# Patient Record
Sex: Male | Born: 2000
Health system: Southern US, Community
[De-identification: ages and names within clinical notes are randomized; demographics above are authoritative.]

## PROBLEM LIST (undated history)

## (undated) DIAGNOSIS — R011 Cardiac murmur, unspecified: Secondary | ICD-10-CM

## (undated) DIAGNOSIS — Y249XXA Unspecified firearm discharge, undetermined intent, initial encounter: Secondary | ICD-10-CM

## (undated) DIAGNOSIS — J45909 Unspecified asthma, uncomplicated: Secondary | ICD-10-CM

## (undated) DIAGNOSIS — W3400XA Accidental discharge from unspecified firearms or gun, initial encounter: Secondary | ICD-10-CM

---
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
## 2019-04-18 DIAGNOSIS — N451 Epididymitis: Secondary | ICD-10-CM | POA: Diagnosis not present

## 2019-04-18 DIAGNOSIS — N341 Nonspecific urethritis: Secondary | ICD-10-CM | POA: Diagnosis not present

## 2019-07-20 ENCOUNTER — Other Ambulatory Visit: Payer: Self-pay

## 2019-07-20 ENCOUNTER — Encounter (HOSPITAL_COMMUNITY): Payer: Self-pay

## 2019-07-20 ENCOUNTER — Emergency Department (HOSPITAL_COMMUNITY)
Admission: EM | Admit: 2019-07-20 | Discharge: 2019-07-20 | Disposition: A | Discharge status: Home / Self Care | Payer: BC Managed Care – PPO | Attending: Emergency Medicine | Admitting: Emergency Medicine

## 2019-07-20 DIAGNOSIS — K0889 Other specified disorders of teeth and supporting structures: Secondary | ICD-10-CM | POA: Diagnosis not present

## 2019-07-20 MED ORDER — AMOXICILLIN 500 MG PO CAPS: 500.0000 mg | ORAL_CAPSULE | Freq: Three times a day (TID) | ORAL | 0 refills | Status: DC

## 2019-07-20 MED ORDER — IBUPROFEN 800 MG PO TABS: 800.0000 mg | ORAL_TABLET | Freq: Three times a day (TID) | ORAL | 0 refills | Status: DC | PRN

## 2019-07-20 NOTE — ED Provider Notes (Signed)
[place] EMERGENCY DEPARTMENT Provider Note   CSN: [id number] Arrival date & time: 07/20/19  1121     History Chief Complaint  Patient presents with  . Dental Pain    right side bottom, pain on right side of face     Noah Stewart is a 18 y.o. male with no relevant past medical history presents to the ED with a 1 week history of worsening right-sided lower tooth discomfort.  Patient reports that he has been taking ibuprofen, with some relief.  He receives regular dental care and has not had this problem before.  He denies any trauma, fevers or chills, difficulty eating or drinking, difficulty swallowing, difficulty breathing, facial swelling, headache or dizziness, or other symptoms.  His mother thought that he may be developing an abscess which is what prompted him to come to the ED for evaluation.  HPI     Past Medical History:  Diagnosis Date  . Murmur     There are no problems to display for this patient.   History reviewed. No pertinent surgical history.     Family History  Problem Relation Age of Onset  . Hypertension Mother   . Heart disease Maternal Grandmother   . Sudden death Maternal Grandmother     Social History   Tobacco Use  . Smoking status: Never Smoker  . Smokeless tobacco: Never Used  Substance Use Topics  . Alcohol use: No  . Drug use: Not Currently    Home Medications Prior to Admission medications   Medication Sig Start Date End Date Taking? Authorizing Provider  albuterol (PROVENTIL HFA;VENTOLIN HFA) 108 (90 BASE) MCG/ACT inhaler Inhale 1-2 puffs into the lungs every 6 (six) hours as needed for wheezing or shortness of breath.    [provider]  amoxicillin (AMOXIL) 500 MG capsule Take 1 capsule (500 mg total) by mouth 3 (three) times daily. 07/20/19   Corena Herter, PA-C  ibuprofen (ADVIL) 800 MG tablet Take 1 tablet (800 mg total) by mouth 3 (three) times daily as needed for moderate pain. 07/20/19    Corena Herter, PA-C  montelukast (SINGULAIR) 4 MG chewable tablet Chew 4 mg by mouth at bedtime.    [provider]  Phenylephrine-DM-GG-APAP [facility] CHILD MULTI-SYMPTOM) 5-10-200-325 MG/10ML LIQD Take 30 mLs by mouth daily as needed (for congestion).    [provider]    Allergies    Patient has no known allergies.  Review of Systems   Review of Systems  Constitutional: Negative for chills and fever.  HENT: Negative for drooling, facial swelling, sore throat, trouble swallowing and voice change.   Neurological: Negative for headaches.    Physical Exam Updated Vital Signs BP 119/72 (BP Location: Right Arm)   Pulse 66   Temp 98.3 F (36.8 C) (Oral)   Resp 16   Ht 6' (1.829 m)   Wt 77.6 kg   SpO2 99%   BMI 23.19 kg/m   Physical Exam Vitals and nursing note reviewed. Exam conducted with a chaperone present.  Constitutional:      Appearance: Normal appearance.  HENT:     Head: Normocephalic and atraumatic.     Mouth/Throat:     Comments: Very mild surrounding erythema at #31.  No significant decay or caries noted.  No soft palate induration or swelling.  No uvular deviation.  Oropharynx is patent.  No masses appreciated.  No evidence of fluctuance concerning for abscess.  No significant tenderness on physical exam.  No  trismus.  Tolerating secretions well.  No voice changes. Eyes:     General: No scleral icterus.    Conjunctiva/sclera: Conjunctivae normal.  Neck:     Comments: Mild right-sided lymphadenitis.  No significant swelling or masses appreciated. Cardiovascular:     Rate and Rhythm: Normal rate and regular rhythm.     Pulses: Normal pulses.     Heart sounds: Normal heart sounds.  Pulmonary:     Effort: Pulmonary effort is normal. No respiratory distress.     Breath sounds: Normal breath sounds.  Musculoskeletal:     Cervical back: Normal range of motion and neck supple.  Skin:    General: Skin is dry.  Neurological:     Mental Status:  He is alert.     GCS: GCS eye subscore is 4. GCS verbal subscore is 5. GCS motor subscore is 6.  Psychiatric:        Mood and Affect: Mood normal.        Behavior: Behavior normal.        Thought Content: Thought content normal.       ED Results / Procedures / Treatments   Labs (all labs ordered are listed, but only abnormal results are displayed) Labs Reviewed - No data to display  EKG None  Radiology No results found.  Procedures Procedures (including critical care time)  Medications Ordered in ED Medications - No data to display  ED Course  I have reviewed the triage vital signs and the nursing notes.  Pertinent labs & imaging results that were available during my care of the patient were reviewed by me and considered in my medical decision making (see chart for details).    MDM Rules/Calculators/A&P                      Patient presents to the ED with a 1 week history of worsening right-sided lower dental discomfort.  On physical exam, mild erythema surrounding #31 tooth concerning for possible infection given his reported worsening pain and discomfort.  Will treat with amoxicillin.  Patient has been taking ibuprofen, with some relief.  Will also prescribe 100 mg ibuprofen 3 times daily as needed for his pain discomfort.    No fluctuance or masses on physical exam suspicious for dental abscess that would require incision and drainage.  No trismus, soft palate induration or swelling, inability to tolerate secretions, inability eat or drink, facial swelling, fevers or chills, uvular deviation, voice changes, or other concerning history or physical exam findings.  Low suspicion for PTA, retropharyngeal abscess, parotitis, or other more emergent conditions.  Patient is in no acute distress and vital signs are within normal limits.  Instructed him to follow-up with his dentist or PCP regarding today's encounter.  Return to the ED or seek medical attention immediately for any new  or worsening symptoms.  Patient voiced understanding and is agreeable to plan.   Final Clinical Impression(s) / ED Diagnoses Final diagnoses:  Pain, dental    Rx / DC Orders ED Discharge Orders         Ordered    amoxicillin (AMOXIL) 500 MG capsule  3 times daily     07/20/19 1239    ibuprofen (ADVIL) 800 MG tablet  3 times daily PRN     07/20/19 1239           Lorelee New, PA-C 07/20/19 1240    Loren Racer, MD 07/20/19 1357

## 2019-07-20 NOTE — ED Triage Notes (Signed)
Pt had pain for about a week, but it has gotten worse. Pain on right side bottom tooth.

## 2019-07-20 NOTE — Discharge Instructions (Signed)
Please follow-up with your PCP and/or dentist regarding today's encounter.  Please take your medications, as prescribed.  Return to the ED or seek medical attention for any new or worsening symptoms including but not limited to fevers or chills, inability to open the mouth, soft palate swelling, tongue swelling, drooling, or inability to eat or drink.

## 2019-11-08 ENCOUNTER — Encounter (HOSPITAL_COMMUNITY): Payer: Self-pay

## 2019-11-08 ENCOUNTER — Other Ambulatory Visit: Payer: Self-pay

## 2019-11-08 ENCOUNTER — Emergency Department (HOSPITAL_COMMUNITY): Payer: BC Managed Care – PPO

## 2019-11-08 ENCOUNTER — Emergency Department (HOSPITAL_COMMUNITY)
Admission: EM | Admit: 2019-11-08 | Discharge: 2019-11-08 | Disposition: A | Discharge status: Home / Self Care | Payer: BC Managed Care – PPO | Attending: Emergency Medicine | Admitting: Emergency Medicine

## 2019-11-08 DIAGNOSIS — Z79899 Other long term (current) drug therapy: Secondary | ICD-10-CM | POA: Insufficient documentation

## 2019-11-08 DIAGNOSIS — R0789 Other chest pain: Secondary | ICD-10-CM | POA: Insufficient documentation

## 2019-11-08 DIAGNOSIS — R079 Chest pain, unspecified: Secondary | ICD-10-CM

## 2019-11-08 LAB — CBC
HCT: 45.3 % (ref 39.0–52.0)
Hemoglobin: 14.8 g/dL (ref 13.0–17.0)
MCH: 28.7 pg (ref 26.0–34.0)
MCHC: 32.7 g/dL (ref 30.0–36.0)
MCV: 88 fL (ref 80.0–100.0)
Platelets: 295 10*3/uL (ref 150–400)
RBC: 5.15 MIL/uL (ref 4.22–5.81)
RDW: 14 % (ref 11.5–15.5)
WBC: 6.9 10*3/uL (ref 4.0–10.5)
nRBC: 0 % (ref 0.0–0.2)

## 2019-11-08 LAB — BASIC METABOLIC PANEL
Anion gap: 12 (ref 5–15)
BUN: 13 mg/dL (ref 6–20)
CO2: 23 mmol/L (ref 22–32)
Calcium: 8.8 mg/dL — ABNORMAL LOW (ref 8.9–10.3)
Chloride: 103 mmol/L (ref 98–111)
Creatinine, Ser: 0.96 mg/dL (ref 0.61–1.24)
GFR calc Af Amer: >60 mL/min (ref >60)
GFR calc non Af Amer: >60 mL/min (ref >60)
Glucose, Bld: 101 mg/dL — ABNORMAL HIGH (ref 70–99)
Potassium: 3.7 mmol/L (ref 3.5–5.1)
Sodium: 138 mmol/L (ref 135–145)

## 2019-11-08 LAB — TROPONIN I (HIGH SENSITIVITY)
Troponin I (High Sensitivity): <2 ng/L (ref <18)
Troponin I (High Sensitivity): <2 ng/L (ref <18)

## 2019-11-08 MED ORDER — SODIUM CHLORIDE 0.9% FLUSH: 3.0000 mL | Freq: Once | INTRAVENOUS | Status: DC

## 2019-11-08 MED ORDER — ACETAMINOPHEN 325 MG PO TABS
650.0000 mg | ORAL_TABLET | Freq: Once | ORAL | Status: AC
  Administered 2019-11-08: 09:00:00 650 mg via ORAL
  Filled 2019-11-08: qty 2

## 2019-11-08 NOTE — ED Triage Notes (Signed)
Pt reports that he began to have L sided CP that started an hour ago with SOB.

## 2019-11-08 NOTE — ED Provider Notes (Signed)
[facility] DEPARTMENT Provider Note   CSN: [id number] Arrival date & time: 11/08/19  0304     History Chief Complaint  Patient presents with  . Chest Pain    Vernon MUMIN DENOMME is a 19 y.o. male.  HPI 19 year old male no significant past medical history presents today complaining of left-sided chest pain. He describes left-sided pressure burning in his chest that began around 2 AM. He states there is some pain when he moves and with deep inspiration. He denies dyspnea. He has not had fever, chills, cough, leg swelling, history of DVT, PE, sickle cell anemia, or family history of sudden early cardiac death. He denies any similar symptoms in the past. The pain is moderate. He feels that the mask in the ED makes this somewhat worse.    Past Medical History:  Diagnosis Date  . Murmur     There are no problems to display for this patient.   History reviewed. No pertinent surgical history.     Family History  Problem Relation Age of Onset  . Hypertension Mother   . Heart disease Maternal Grandmother   . Sudden death Maternal Grandmother     Social History   Tobacco Use  . Smoking status: Never Smoker  . Smokeless tobacco: Never Used  Substance Use Topics  . Alcohol use: No  . Drug use: Not Currently    Home Medications Prior to Admission medications   Medication Sig Start Date End Date Taking? Authorizing Provider  albuterol (PROVENTIL HFA;VENTOLIN HFA) 108 (90 BASE) MCG/ACT inhaler Inhale 1-2 puffs into the lungs every 6 (six) hours as needed for wheezing or shortness of breath.    [provider]  amoxicillin (AMOXIL) 500 MG capsule Take 1 capsule (500 mg total) by mouth 3 (three) times daily. 07/20/19   Corena Herter, PA-C  ibuprofen (ADVIL) 800 MG tablet Take 1 tablet (800 mg total) by mouth 3 (three) times daily as needed for moderate pain. 07/20/19   Corena Herter, PA-C  montelukast (SINGULAIR) 4 MG chewable tablet Chew  4 mg by mouth at bedtime.    [provider]  Phenylephrine-DM-GG-APAP [facility] CHILD MULTI-SYMPTOM) 5-10-200-325 MG/10ML LIQD Take 30 mLs by mouth daily as needed (for congestion).    [provider]    Allergies    Patient has no known allergies.  Review of Systems   Review of Systems  All other systems reviewed and are negative.   Physical Exam Updated Vital Signs BP 110/72   Pulse (!) 52   Temp 98.5 F (36.9 C) (Oral)   Resp 17   Ht 1.829 m (6')   Wt 77.1 kg   SpO2 93%   BMI 23.06 kg/m   Physical Exam Vitals and nursing note reviewed.  Constitutional:      Appearance: He is well-developed.  HENT:     Head: Normocephalic and atraumatic.     Right Ear: External ear normal.     Left Ear: External ear normal.     Nose: Nose normal.  Eyes:     Conjunctiva/sclera: Conjunctivae normal.     Pupils: Pupils are equal, round, and reactive to light.  Cardiovascular:     Rate and Rhythm: Normal rate and regular rhythm.     Heart sounds: Normal heart sounds.  Pulmonary:     Effort: Pulmonary effort is normal.     Breath sounds: Normal breath sounds.  Abdominal:     General: Bowel sounds are normal.  Palpations: Abdomen is soft.  Musculoskeletal:        General: Normal range of motion.     Cervical back: Normal range of motion and neck supple.  Skin:    General: Skin is warm and dry.  Neurological:     Mental Status: He is alert and oriented to person, place, and time.     Deep Tendon Reflexes: Reflexes are normal and symmetric.  Psychiatric:        Behavior: Behavior normal.        Thought Content: Thought content normal.        Judgment: Judgment normal.     ED Results / Procedures / Treatments   Labs (all labs ordered are listed, but only abnormal results are displayed) Labs Reviewed  BASIC METABOLIC PANEL - Abnormal; Notable for the following components:      Result Value   Glucose, Bld 101 (*)    Calcium 8.8 (*)    All other  components within normal limits  CBC  TROPONIN I (HIGH SENSITIVITY)  TROPONIN I (HIGH SENSITIVITY)    EKG EKG Interpretation  Date/Time:  Thursday November 08 2019 03:10:38 EDT Ventricular Rate:  82 PR Interval:  154 QRS Duration: 88 QT Interval:  348 QTC Calculation: 406 R Axis:   79 Text Interpretation: Normal sinus rhythm with sinus arrhythmia Normal ECG When compared with ECG of 01/27/2017, No significant change was found Confirmed by Dione Booze ([id number]) on 11/08/2019 3:18:26 AM  EKG personally reviewed and agree with reading Radiology DG Chest 2 View  Result Date: 11/08/2019 CLINICAL DATA:  Chest pain EXAM: CHEST - 2 VIEW COMPARISON:  None. FINDINGS: The heart size and mediastinal contours are within normal limits. Both lungs are clear. The visualized skeletal structures are unremarkable. IMPRESSION: No active cardiopulmonary disease. Electronically Signed   By: Sharlet Salina M.D.   On: 11/08/2019 03:29   Chest x-Lenford Beddow reviewed and agree with read Procedures Procedures (including critical care time)  Medications Ordered in ED Medications  sodium chloride flush (NS) 0.9 % injection 3 mL (0 mLs Intravenous Hold 11/08/19 0801)    ED Course  I have reviewed the triage vital signs and the nursing notes.  Pertinent labs & imaging results that were available during my care of the patient were reviewed by me and considered in my medical decision making (see chart for details).  Clinical Course as of Nov 07 904  Thu Nov 08, 2019  0905 CBC, bmet and troponins reviewed   [DR]    Clinical Course User Index [DR] Margarita Grizzle, MD   MDM Rules/Calculators/A&P                     Well-developed well-nourished 19 year old male no sniffing a past medical history with nonreproducible chest pain. EKG here normal and troponin normal x2. Very low index of suspicion for acute coronary syndrome/MI. Patient's lungs are clear and chest x-Jovanny Stephanie reveals no evidence of pneumothorax, infiltrate,  widened mediastinum, chest wall injury. Physical exam reveals normal abdomen and have low index of suspicion for upper abdominal process space specifically GI origin such as biliary colic or gastritis. However reflux could be considered as source. Also doubt embolic disease as patient has normal oxygen saturations and normal heart rate. Discussed above with patient. We have reviewed return precautions and need for follow-up and he voices understanding. Final Clinical Impression(s) / ED Diagnoses Final diagnoses:  Nonspecific chest pain    Rx / DC Orders ED Discharge Orders  None       Margarita Grizzle, MD 11/08/19 [phone]

## 2019-11-08 NOTE — ED Notes (Signed)
De Burrs would like an update [phone]

## 2020-02-24 ENCOUNTER — Ambulatory Visit (HOSPITAL_COMMUNITY)
Admission: EM | Admit: 2020-02-24 | Discharge: 2020-02-24 | Disposition: A | Discharge status: Home / Self Care | Payer: BC Managed Care – PPO

## 2020-02-24 ENCOUNTER — Other Ambulatory Visit: Payer: Self-pay

## 2020-02-24 NOTE — ED Notes (Signed)
Went to call patient for rooming and triage. Patient not in lobby. No patients in lobby at this time.

## 2020-02-25 ENCOUNTER — Other Ambulatory Visit: Payer: Self-pay

## 2020-02-25 ENCOUNTER — Emergency Department (HOSPITAL_COMMUNITY)
Admission: EM | Admit: 2020-02-25 | Discharge: 2020-02-25 | Disposition: A | Discharge status: Home / Self Care | Payer: BC Managed Care – PPO | Attending: Emergency Medicine | Admitting: Emergency Medicine

## 2020-02-25 ENCOUNTER — Encounter (HOSPITAL_COMMUNITY): Payer: Self-pay | Admitting: Emergency Medicine

## 2020-02-25 DIAGNOSIS — J029 Acute pharyngitis, unspecified: Secondary | ICD-10-CM | POA: Diagnosis present

## 2020-02-25 DIAGNOSIS — J351 Hypertrophy of tonsils: Secondary | ICD-10-CM | POA: Insufficient documentation

## 2020-02-25 LAB — GROUP A STREP BY PCR: Group A Strep by PCR: NOT DETECTED

## 2020-02-25 MED ORDER — ACETAMINOPHEN 325 MG PO TABS
650.0000 mg | ORAL_TABLET | Freq: Once | ORAL | Status: DC | PRN
  Filled 2020-02-25: qty 2

## 2020-02-25 NOTE — Discharge Instructions (Signed)
You have enlarged tonsils.  Continue Motrin for pain.  See ENT doctor in the month if you have persistent swelling of your tonsils.  Return to ER if you have trouble swallowing, trouble breathing, fever.

## 2020-02-25 NOTE — ED Triage Notes (Signed)
Patient is complaining of sore throat and on the right side in the back his mouth has a hole that patient is concerned about.

## 2020-02-25 NOTE — ED Provider Notes (Addendum)
[facility]-EMERGENCY DEPT Provider Note   CSN: [id number] Arrival date & time: 02/25/20  0157     History Chief Complaint  Patient presents with  . Sore Throat    Noah Stewart is a 19 y.o. male also healthy here presenting with sore throat.  Patient has been having sore throat for the last 2 weeks.  Patient states that he thought there may be a hole behind his right tonsils about 2 weeks ago.  Patient has been taking ibuprofen with minimal relief.  Patient has a nonproductive cough but denies any fevers or chills or shortness of breath.  Patient did not receive the Covid vaccine.  Denies any sick contacts.  Denies any recurrent throat infections.  The history is provided by the patient.       Past Medical History:  Diagnosis Date  . Murmur     There are no problems to display for this patient.   History reviewed. No pertinent surgical history.     Family History  Problem Relation Age of Onset  . Hypertension Mother   . Heart disease Maternal Grandmother   . Sudden death Maternal Grandmother     Social History   Tobacco Use  . Smoking status: Never Smoker  . Smokeless tobacco: Never Used  Vaping Use  . Vaping Use: Never used  Substance Use Topics  . Alcohol use: No  . Drug use: Not Currently    Home Medications Prior to Admission medications   Medication Sig Start Date End Date Taking? Authorizing Provider  albuterol (PROVENTIL HFA;VENTOLIN HFA) 108 (90 BASE) MCG/ACT inhaler Inhale 1-2 puffs into the lungs every 6 (six) hours as needed for wheezing or shortness of breath.    [provider]  amoxicillin (AMOXIL) 500 MG capsule Take 1 capsule (500 mg total) by mouth 3 (three) times daily. 07/20/19   Lorelee New, PA-C  ibuprofen (ADVIL) 800 MG tablet Take 1 tablet (800 mg total) by mouth 3 (three) times daily as needed for moderate pain. 07/20/19   Lorelee New, PA-C  montelukast (SINGULAIR) 4 MG chewable tablet Chew 4  mg by mouth at bedtime.    [provider]  Phenylephrine-DM-GG-APAP [facility] CHILD MULTI-SYMPTOM) 5-10-200-325 MG/10ML LIQD Take 30 mLs by mouth daily as needed (for congestion).    [provider]    Allergies    Patient has no known allergies.  Review of Systems   Review of Systems  HENT: Positive for sore throat.   All other systems reviewed and are negative.   Physical Exam Updated Vital Signs BP (!) 127/89 (BP Location: Left Arm)   Pulse 87   Temp 98.9 F (37.2 C) (Oral)   Resp 14   Ht 6\' 1"  (1.854 m)   Wt 72.6 kg   SpO2 98%   BMI 21.11 kg/m   Physical Exam Vitals and nursing note reviewed.  Constitutional:      Appearance: He is well-developed.  HENT:     Head: Normocephalic.     Mouth/Throat:     Tonsils: Tonsillar abscess present. No tonsillar exudate.     Comments: Bilateral tonsils are enlarged but not red.  Uvula is midline.  There is no tonsillar exudates.  There is no obvious hole behind the tonsils. Eyes:     Conjunctiva/sclera: Conjunctivae normal.  Neck:     Comments: No cervical lymphadenopathy. Cardiovascular:     Rate and Rhythm: Normal rate and regular rhythm.  Pulmonary:     Effort:  Pulmonary effort is normal.     Breath sounds: Normal breath sounds.  Abdominal:     Palpations: Abdomen is soft.  Musculoskeletal:     Cervical back: Normal range of motion.  Skin:    General: Skin is warm.     Capillary Refill: Capillary refill takes less than 2 seconds.  Neurological:     General: No focal deficit present.     Mental Status: He is alert and oriented to person, place, and time.  Psychiatric:        Mood and Affect: Mood normal.        Behavior: Behavior normal.     ED Results / Procedures / Treatments   Labs (all labs ordered are listed, but only abnormal results are displayed) Labs Reviewed  GROUP A STREP BY PCR    EKG None  Radiology No results found.  Procedures Procedures (including critical care  time)  Medications Ordered in ED Medications  acetaminophen (TYLENOL) tablet 650 mg (650 mg Oral Refused 02/25/20 0417)    ED Course  I have reviewed the triage vital signs and the nursing notes.  Pertinent labs & imaging results that were available during my care of the patient were reviewed by me and considered in my medical decision making (see chart for details).    MDM Rules/Calculators/A&P                         Noah Stewart is a 19 y.o. male here presenting with enlarged tonsils.  The tonsils are enlarged but does not appear infected.  Uvula is midline is no signs of peritonsillar abscess.  Patient's strep test is negative.  At this point, will hold off antibiotics.  Will refer to ENT recommend continue Motrin. Considered COVID infection and offered COVID testing but patient refused.    Final Clinical Impression(s) / ED Diagnoses Final diagnoses:  Enlarged tonsils    Rx / DC Orders ED Discharge Orders    None       Charlynne Pander, MD 02/25/20 1224    Charlynne Pander, MD 02/25/20 0430

## 2020-10-06 ENCOUNTER — Other Ambulatory Visit: Payer: Self-pay

## 2020-10-06 ENCOUNTER — Emergency Department (HOSPITAL_COMMUNITY)
Admission: EM | Admit: 2020-10-06 | Discharge: 2020-10-07 | Disposition: A | Discharge status: Home / Self Care | Payer: BC Managed Care – PPO | Source: Home / Self Care | Attending: Emergency Medicine | Admitting: Emergency Medicine

## 2020-10-06 ENCOUNTER — Encounter (HOSPITAL_COMMUNITY): Payer: Self-pay | Admitting: Emergency Medicine

## 2020-10-06 DIAGNOSIS — Z20822 Contact with and (suspected) exposure to covid-19: Secondary | ICD-10-CM | POA: Insufficient documentation

## 2020-10-06 DIAGNOSIS — F333 Major depressive disorder, recurrent, severe with psychotic symptoms: Secondary | ICD-10-CM | POA: Insufficient documentation

## 2020-10-06 DIAGNOSIS — F332 Major depressive disorder, recurrent severe without psychotic features: Secondary | ICD-10-CM | POA: Diagnosis not present

## 2020-10-06 DIAGNOSIS — R45850 Homicidal ideations: Secondary | ICD-10-CM

## 2020-10-06 DIAGNOSIS — F1210 Cannabis abuse, uncomplicated: Secondary | ICD-10-CM | POA: Diagnosis not present

## 2020-10-06 DIAGNOSIS — Z560 Unemployment, unspecified: Secondary | ICD-10-CM | POA: Diagnosis not present

## 2020-10-06 DIAGNOSIS — Z79899 Other long term (current) drug therapy: Secondary | ICD-10-CM | POA: Diagnosis not present

## 2020-10-06 DIAGNOSIS — R45851 Suicidal ideations: Secondary | ICD-10-CM

## 2020-10-06 LAB — COMPREHENSIVE METABOLIC PANEL
ALT: 12 U/L (ref 0–44)
AST: 16 U/L (ref 15–41)
Albumin: 4.2 g/dL (ref 3.5–5.0)
Alkaline Phosphatase: 40 U/L (ref 38–126)
Anion gap: 7 (ref 5–15)
BUN: 8 mg/dL (ref 6–20)
CO2: 25 mmol/L (ref 22–32)
Calcium: 9.4 mg/dL (ref 8.9–10.3)
Chloride: 105 mmol/L (ref 98–111)
Creatinine, Ser: 0.91 mg/dL (ref 0.61–1.24)
GFR, Estimated: >60 mL/min (ref >60)
Glucose, Bld: 80 mg/dL (ref 70–99)
Potassium: 4.8 mmol/L (ref 3.5–5.1)
Sodium: 137 mmol/L (ref 135–145)
Total Bilirubin: 1.1 mg/dL (ref 0.3–1.2)
Total Protein: 7.9 g/dL (ref 6.5–8.1)

## 2020-10-06 LAB — CBC
HCT: 49.6 % (ref 39.0–52.0)
Hemoglobin: 16 g/dL (ref 13.0–17.0)
MCH: 28.9 pg (ref 26.0–34.0)
MCHC: 32.3 g/dL (ref 30.0–36.0)
MCV: 89.5 fL (ref 80.0–100.0)
Platelets: 275 10*3/uL (ref 150–400)
RBC: 5.54 MIL/uL (ref 4.22–5.81)
RDW: 14.1 % (ref 11.5–15.5)
WBC: 5.1 10*3/uL (ref 4.0–10.5)
nRBC: 0 % (ref 0.0–0.2)

## 2020-10-06 LAB — RAPID URINE DRUG SCREEN, HOSP PERFORMED
Amphetamines: NOT DETECTED
Barbiturates: NOT DETECTED
Benzodiazepines: NOT DETECTED
Cocaine: NOT DETECTED
Opiates: NOT DETECTED
Tetrahydrocannabinol: POSITIVE — AB

## 2020-10-06 LAB — SALICYLATE LEVEL: Salicylate Lvl: <7 mg/dL — ABNORMAL LOW (ref 7.0–30.0)

## 2020-10-06 LAB — ETHANOL: Alcohol, Ethyl (B): <10 mg/dL (ref <10)

## 2020-10-06 LAB — RESP PANEL BY RT-PCR (FLU A&B, COVID) ARPGX2
Influenza A by PCR: NEGATIVE
Influenza B by PCR: NEGATIVE
SARS Coronavirus 2 by RT PCR: NEGATIVE

## 2020-10-06 LAB — ACETAMINOPHEN LEVEL: Acetaminophen (Tylenol), Serum: <10 ug/mL — ABNORMAL LOW (ref 10–30)

## 2020-10-06 MED ORDER — LORAZEPAM 1 MG PO TABS
1.0000 mg | ORAL_TABLET | Freq: Once | ORAL | Status: AC
  Administered 2020-10-06: 1 mg via ORAL
  Filled 2020-10-06: qty 1

## 2020-10-06 NOTE — ED Triage Notes (Signed)
Patient coming from home. Complaint of SI/HI today. Per parent, this has been on ongoing issue, today however was the first time patient verbalized a plan. Patient closed-off, unwilling to speak in triage.

## 2020-10-06 NOTE — ED Notes (Signed)
Dinner tray ordered.

## 2020-10-06 NOTE — BH Assessment (Signed)
Clinician spoke to [place], [place], pt to be placed in a private room for assessment. Clinician to call the TTS cart in 15 minutes.     Redmond Pulling, MS, [facility], [facility] Triage Specialist [phone]

## 2020-10-06 NOTE — ED Notes (Signed)
ED Provider at bedside. 

## 2020-10-06 NOTE — BH Assessment (Signed)
Comprehensive Clinical Assessment (CCA) Note  10/06/2020 Noah Stewart [id number]   Disposition: Noah BackEddie Nwoko, PA-C recommends inpatient treatment. [facility]. Disposition discussed with Noah BareLogan, PA-C and Noah RungMonique, RN. Disposition CSW to seek placement.   [facility]  C-SSRS RISK CATEGORY High Risk     Per Noah RungMonique, RN pt has a Comptrollersitter.   The patient demonstrates the following risk factors for suicide: Chronic risk factors for suicide include: previous self-harm pt reports, punching walls, scratching himself. Acute risk factors for suicide include: family or marital conflict, unemployment and pt's grandmother passessed away but kicked out of his mothers' home, SI with a plan, HI. Protective factors for this patient include: positive social support. Considering these factors, the overall suicide risk at this point appears to be high. Patient is appropriate for outpatient follow up.  Noah Stewart is a 20 year old male who [facility] Clinician asked the pt, "what brought you to the hospital?" Pt reported, his father felt like he needed help because their conversations escalate quickly. Pt reported, he's been flipping out (verbally, breaking is phone, punching walls.) Pt reported, about a month ago, his maternal grandmother passed away and shortly after he got into an argument with his mother and was kicked out of her home. Pt reported, he was told he punched walls but doesn't remember doing it. Pt reported, he blacks out at times. Pt reported, when his mother kicked him out he grew a big hatred for her. Pt reported, "I'm going to hurt myself or others." Pt reported, he's suicidal, he has a lot of different thoughts, plans of strangulation, shooting himself. Pt reported, when he's suicidal he's homicidal. Pt reported, he rather kill his stepfather so his mother can feel how he feels.  Pt reported, he feel his depression has worsened. Pt reported, "I don't want to be here no more." Pt reported, feeling someone is out to get him. Pt denies, access to weapons.   Pt reports, marijuana use two weeks ago but doesn't use often. Pt's UDS is positive for marijuana. Pt denies, being linked to OPT resources (medication management and/or counseling.) Pt denies, previous inpatient admissions.   Pt presents quiet, awake in scrubs with normal speech. Pt's eye contact was poor pt looked down mostly during the assessment. Pt's mood was depressed. Pt's affect was depressed, flat. Pt's thought process was appropriate to mood and circumstances. [facility], [facility] can [facility] for [facility].   *Pt consented for clinician to contact his father Noah Stewart(Noah Stewart, [phone]) to obtain additional information. Per father, the pt has been having a rough times over the last few weeks. Per father, the pt loss his maternal great-grandmother, he was kicked out of his mothers' house in the middle of the night, breakup with girlfriend. Pt's father reported, the pt can be verbally and physically aggressive he has not been physically aggressive towards him. Per father, the pt was going to leave him a letter that is when he discussed getting help with the pt. Pt's father reported, it worries him if the pt were to be discharged after the pt gets the help he needs he will have the pt move Stewart in with him.*   Diagnosis: Major Depressive Disorder, recurrent, severe with psychotic features.   Chief Complaint:  Chief Complaint  Patient presents with  . Suicidal  . Homicidal   Visit Diagnosis:  CCA Screening, Triage and Referral (STR)  Patient Reported Information How did you hear about [place]? No data recorded Referral name: No data recorded Referral phone number: No data recorded  Whom do you see for routine medical problems? No data recorded Practice/Facility Name: No data  recorded Practice/Facility Phone Number: No data recorded Name of Contact: No data recorded Contact Number: No data recorded Contact Fax Number: No data recorded Prescriber Name: No data recorded Prescriber Address (if known): No data recorded  What Is the Reason for Your Visit/Call Today? No data recorded How Long Has This Been Causing You Problems? No data recorded What Do You Feel Would Help You the Most Today? No data recorded  Have You Recently Been in Any Inpatient Treatment (Hospital/Detox/Crisis Center/28-Day Program)? No data recorded Name/Location of Program/Hospital:No data recorded How Long Were You There? No data recorded When Were You Discharged? No data recorded  Have You Ever Received Services From [facility] Before? No data recorded Who Do You See at [facility]? No data recorded  Have You Recently Had Any Thoughts About Hurting Yourself? No data recorded Are You Planning to Commit Suicide/Harm Yourself At This time? No data recorded  Have you Recently Had Thoughts About Hurting Someone Noah Stewart? No data recorded Explanation: No data recorded  Have You Used Any Alcohol or Drugs in the Past 24 Hours? No data recorded How Long Ago Did You Use Drugs or Alcohol? No data recorded What Did You Use and How Much? No data recorded  Do You Currently Have a Therapist/Psychiatrist? No data recorded Name of Therapist/Psychiatrist: No data recorded  Have You Been Recently Discharged From Any Office Practice or Programs? No data recorded Explanation of Discharge From Practice/Program: No data recorded    CCA Screening Triage Referral Assessment Type of Contact: No data recorded Is this Initial or Reassessment? No data recorded Date Telepsych consult ordered in [facility]:  No data recorded Time Telepsych consult ordered in [facility]:  No data recorded  Patient Reported Information Reviewed? No data recorded Patient Left Without Being Seen? No data recorded Reason for Not Completing  Assessment: No data recorded  Collateral Involvement: No data recorded  Does Patient Have a Court Appointed Legal Guardian? No data recorded Name and Contact of Legal Guardian: No data recorded If Minor and Not Living with Parent(s), Who has Custody? No data recorded Is CPS involved or ever been involved? No data recorded Is APS involved or ever been involved? No data recorded  Patient Determined To Be At Risk for Harm To Self or Others Based on Review of Patient Reported Information or Presenting Complaint? No data recorded Method: No data recorded Availability of Means: No data recorded Intent: No data recorded Notification Required: No data recorded Additional Information for Danger to Others Potential: No data recorded Additional Comments for Danger to Others Potential: No data recorded Are There Guns or Other Weapons in Your Home? No data recorded Types of Guns/Weapons: No data recorded Are These Weapons Safely Secured?                            No data recorded Who Could Verify You Are Able To Have These Secured: No data recorded Do You Have any Outstanding Charges, Pending Court Dates, Parole/Probation? No data recorded Contacted To Inform of Risk of Harm To Self or Others: No data recorded  Location of Assessment: No data recorded  Does Patient Present under Involuntary Commitment? No data recorded IVC  Papers Initial File Date: No data recorded  [place] of Residence: No data recorded  Patient Currently Receiving the Following Services: No data recorded  Determination of Need: No data recorded  Options For Referral: No data recorded    CCA Biopsychosocial Intake/Chief Complaint:  Per EDP/PA note: "Patient is a 20 year old male with no pertinent medical history who presents the emergency department due to SI/HI.  He was brought into the emergency department today by his father. Patient states that he has been depressed for a long time. He was close with his grandmother  who passed away about 3 to 4 weeks ago. He feels as if his symptoms have been worsening since then. He states "I feel like I am alone".  He also notes "I do not feel like there is anyone there for me". He notes having thoughts of hurting himself. No specific plan just stating that "I could do anything". Also reports HI. States that he does not want to hurt anyone specific but just "wants other people to feel his pain" before hurting himself.  Patient has no somatic complaints at this time. States he smokes marijuana intermittently but denies any alcohol use. Denies any other drug use."  Current Symptoms/Problems: SI/HI, paranoia, depression/anixety symptoms.   Patient Reported Schizophrenia/Schizoaffective Diagnosis in Past: No data recorded  Strengths: Not assessed.  Preferences: Not assessed.  Abilities: Not assessed.   Type of Services Patient Feels are Needed: Pt reported, if discharged [facility] he can contract for safety. Pt's fathe repored, it worries him if the pt is discharged based on his SI/HI.   Initial Clinical Notes/Concerns: No data recorded  Mental Health Symptoms Depression:  Change in energy/activity; Sleep (too much or little); Tearfulness; Difficulty Concentrating; Worthlessness; Weight gain/loss; Hopelessness; Increase/decrease in appetite; Fatigue (Isolation, guilt.)   Duration of Depressive symptoms: Greater than two weeks   Mania:  No data recorded  Anxiety:   Worrying; Restlessness; Irritability; Fatigue   Psychosis:  -- (Paranoia.)   Duration of Psychotic symptoms: No data recorded  Trauma:  No data recorded  Obsessions:  None   Compulsions:  None   Inattention:  Forgetful; Loses things; Disorganized   Hyperactivity/Impulsivity:  Blurts out answers   Oppositional/Defiant Behaviors:  Argumentative; Easily annoyed   Emotional Irregularity:  Intense/inappropriate anger; Chronic feelings of emptiness; Recurrent suicidal behaviors/gestures/threats    Other Mood/Personality Symptoms:  No data recorded   Mental Status Exam Appearance and self-care  Stature:  Average   Weight:  Average weight   Clothing:  -- (Pt in scrubs.)   Grooming:  No data recorded  Cosmetic use:  Excessive   Posture/gait:  No data recorded  Motor activity:  Not Remarkable   Sensorium  Attention:  Normal   Concentration:  Normal   Orientation:  X5   Recall/memory:  Normal   Affect and Mood  Affect:  Depressed; Flat   Mood:  Depressed   Relating  Eye contact:  -- (Poor, pt had his head down during most of the assessment.)   Facial expression:  Depressed   Attitude toward examiner:  Cooperative   Thought and Language  Speech flow: Normal   Thought content:  Appropriate to Mood and Circumstances   Preoccupation:  Other (Comment); Suicide (Depression.)   Hallucinations:  None   Organization:  No data recorded  [employer] of Knowledge:  Fair   Intelligence:  Average   Abstraction:  No data recorded  Judgement:  Poor   Reality Testing:  No data recorded  Insight:  Fair   Decision Making:  Impulsive   Social Functioning  Social Maturity:  Impulsive   Social Judgement:  No data recorded  Stress  Stressors:  Family conflict; Grief/losses   Coping Ability:  Overwhelmed   Skill Deficits:  Scientist, physiological; Self-control   Supports:  Family     Religion: Religion/Spirituality Are You A Religious Person?:  (Pt reported, "I don't know.")  Leisure/Recreation: Leisure / Recreation Do You Have Hobbies?: Yes Leisure and Hobbies: Music-rap/sing.  Exercise/Diet: Exercise/Diet Do You Follow a Special Diet?: No Do You Have Any Trouble Sleeping?: Yes Explanation of Sleeping Difficulties: Pt reported getting five hours of sleep per night.   CCA Employment/Education Employment/Work Situation: Employment / Work Situation Employment situation: Unemployed What is the longest time patient has a held a job?: Not  assessed. Where was the patient employed at that time?: Not assessed. Has patient ever been in the [employer]?: No  Education: Education Is Patient Currently Attending School?: No Last Grade Completed: 12 Did You Graduate From [employer]?: Yes Did You Attend College?: No Did You Attend Graduate School?: No   CCA Family/Childhood History Family and Relationship History: Family history Marital status: Single Are you sexually active?: No What is your sexual orientation?: Not assessed. Has your sexual activity been affected by drugs, alcohol, medication, or emotional stress?: Not assessed.  Childhood History:  Childhood History By whom was/is the patient raised?: Father Additional childhood history information: Per father, the pt lived with him until his Sophomore year in high school, his mother recently kicked him out of her house. Description of patient's relationship with caregiver when they were a child: Not assessed. Patient's description of current relationship with people who raised him/her: Not assessed. How were you disciplined when you got in trouble as a child/adolescent?: Not assessed. Does patient have siblings?: Yes Number of Siblings: 1 Description of patient's current relationship with siblings: Not assessed. Did patient suffer any verbal/emotional/physical/sexual abuse as a child?: No Has patient ever been sexually abused/assaulted/raped as an adolescent or adult?: No Witnessed domestic violence?: No Has patient been affected by domestic violence as an adult?:  (NA)  Child/Adolescent Assessment:     CCA Substance Use Alcohol/Drug Use: Alcohol / Drug Use Pain Medications: See MAR Prescriptions: See MAR Over the Counter: See MAR History of alcohol / drug use?: Yes Substance #1 Name of Substance 1: Maiju[place]. 1 - Age of First Use: UTA 1 - Amount (size/oz): Pt reported, "not that much." 1 - Frequency: Pt reported, "every so often." 1 - Duration: Ongoing. 1  - Last Use / Amount: Per pt, two weeks ago. 1 - Method of Aquiring: Ilegal purchase. 1- Route of Use: UTA    ASAM's:  Six Dimensions of Multidimensional Assessment  Dimension 1:  Acute Intoxication and/or Withdrawal Potential:   Dimension 1:  Description of individual's past and current experiences of substance use and withdrawal: 0  Dimension 2:  Biomedical Conditions and Complications:   Dimension 2:  Description of patient's biomedical conditions and  complications: 0  Dimension 3:  Emotional, Behavioral, or Cognitive Conditions and Complications:  Dimension 3:  Description of emotional, behavioral, or cognitive conditions and complications: 3  Dimension 4:  Readiness to Change:  Dimension 4:  Description of Readiness to Change criteria: 1  Dimension 5:  Relapse, Continued use, or Continued Problem Potential:  Dimension 5:  Relapse, continued use, or continued problem potential critiera description: 1  Dimension 6:  Recovery/Living Environment:  Dimension 6:  Recovery/Iiving environment criteria  description: 1  ASAM Severity Score: ASAM's Severity Rating Score: 6  ASAM Recommended Level of Treatment: ASAM Recommended Level of Treatment: Level II Intensive Outpatient Treatment   Substance use Disorder (SUD)    Recommendations for Services/Supports/Treatments: Recommendations for Services/Supports/Treatments Recommendations For Services/Supports/Treatments: Inpatient Hospitalization  DSM5 Diagnoses: There are no problems to display for this patient.   Referrals to Alternative Service(s): Referred to Alternative Service(s):   Place:   Date:   Time:    Referred to Alternative Service(s):   Place:   Date:   Time:    Referred to Alternative Service(s):   Place:   Date:   Time:    Referred to Alternative Service(s):   Place:   Date:   Time:     Redmond Pulling, [facility]  Comprehensive Clinical Assessment (CCA) Screening, Triage and Referral Note  10/06/2020 Noah Stewart [id number]  Chief Complaint:  Chief Complaint  Patient presents with  . Suicidal  . Homicidal   Visit Diagnosis:   Patient Reported Information How did you hear about [place]? No data recorded  Referral name: No data recorded  Referral phone number: No data recorded Whom do you see for routine medical problems? No data recorded  Practice/Facility Name: No data recorded  Practice/Facility Phone Number: No data recorded  Name of Contact: No data recorded  Contact Number: No data recorded  Contact Fax Number: No data recorded  Prescriber Name: No data recorded  Prescriber Address (if known): No data recorded What Is the Reason for Your Visit/Call Today? No data recorded How Long Has This Been Causing You Problems? No data recorded Have You Recently Been in Any Inpatient Treatment (Hospital/Detox/Crisis Center/28-Day Program)? No data recorded  Name/Location of Program/Hospital:No data recorded  How Long Were You There? No data recorded  When Were You Discharged? No data recorded Have You Ever Received Services From [facility] Before? No data recorded  Who Do You See at [facility]? No data recorded Have You Recently Had Any Thoughts About Hurting Yourself? No data recorded  Are You Planning to Commit Suicide/Harm Yourself At This time?  No data recorded Have you Recently Had Thoughts About Hurting Someone Noah Stewart? No data recorded  Explanation: No data recorded Have You Used Any Alcohol or Drugs in the Past 24 Hours? No data recorded  How Long Ago Did You Use Drugs or Alcohol?  No data recorded  What Did You Use and How Much? No data recorded What Do You Feel Would Help You the Most Today? No data recorded Do You Currently Have a Therapist/Psychiatrist? No data recorded  Name of Therapist/Psychiatrist: No data recorded  Have You Been Recently Discharged From Any Office Practice or Programs? No data recorded  Explanation of Discharge From Practice/Program:  No data  recorded    CCA Screening Triage Referral Assessment Type of Contact: No data recorded  Is this Initial or Reassessment? No data recorded  Date Telepsych consult ordered in [facility]:  No data recorded  Time Telepsych consult ordered in [facility]:  No data recorded Patient Reported Information Reviewed? No data recorded  Patient Left Without Being Seen? No data recorded  Reason for Not Completing Assessment: No data recorded Collateral Involvement: No data recorded Does Patient Have a Court Appointed Legal Guardian? No data recorded  Name and Contact of Legal Guardian:  No data recorded If Minor and Not Living with Parent(s), Who has Custody? No data recorded Is CPS involved or ever been involved? No data recorded Is APS involved  or ever been involved? No data recorded Patient Determined To Be At Risk for Harm To Self or Others Based on Review of Patient Reported Information or Presenting Complaint? No data recorded  Method: No data recorded  Availability of Means: No data recorded  Intent: No data recorded  Notification Required: No data recorded  Additional Information for Danger to Others Potential:  No data recorded  Additional Comments for Danger to Others Potential:  No data recorded  Are There Guns or Other Weapons in Your Home?  No data recorded   Types of Guns/Weapons: No data recorded   Are These Weapons Safely Secured?                              No data recorded   Who Could Verify You Are Able To Have These Secured:    No data recorded Do You Have any Outstanding Charges, Pending Court Dates, Parole/Probation? No data recorded Contacted To Inform of Risk of Harm To Self or Others: No data recorded Location of Assessment: No data recorded Does Patient Present under Involuntary Commitment? No data recorded  IVC Papers Initial File Date: No data recorded  [place] of Residence: No data recorded Patient Currently Receiving the Following Services: No data recorded  Determination of  Need: No data recorded  Options For Referral: No data recorded  Redmond Pulling, [facility]     Redmond Pulling, MS, [facility], [facility] Triage Specialist [phone]

## 2020-10-06 NOTE — ED Provider Notes (Signed)
[facility] EMERGENCY DEPARTMENT Provider Note   CSN: [id number] Arrival date & time: 10/06/20  1505     History Chief Complaint  Patient presents with  . Suicidal  . Homicidal    Noah Stewart is a 19 y.o. male.  HPI Patient is a 20 year old male with no pertinent medical history who presents the emergency department due to SI/HI.  He was brought into the emergency department today by his father.  Patient states that he has been depressed for a long time.  He was close with his grandmother who passed away about 3 to 4 weeks ago.  He feels as if his symptoms have been worsening since then.  He states "I feel like I am alone".  He also notes "I do not feel like there is anyone there for me".  He notes having thoughts of hurting himself.  No specific plan just stating that "I could do anything".  Also reports HI.  States that he does not want to hurt anyone specific but just "wants other people to feel his pain" before hurting himself.  Patient has no somatic complaints at this time.  States he smokes marijuana intermittently but denies any alcohol use.  Denies any other drug use.    Past Medical History:  Diagnosis Date  . Murmur     There are no problems to display for this patient.   History reviewed. No pertinent surgical history.     Family History  Problem Relation Age of Onset  . Hypertension Mother   . Heart disease Maternal Grandmother   . Sudden death Maternal Grandmother     Social History   Tobacco Use  . Smoking status: Never Smoker  . Smokeless tobacco: Never Used  Vaping Use  . Vaping Use: Never used  Substance Use Topics  . Alcohol use: No  . Drug use: Not Currently    Home Medications Prior to Admission medications   Medication Sig Start Date End Date Taking? Authorizing Provider  albuterol (PROVENTIL HFA;VENTOLIN HFA) 108 (90 BASE) MCG/ACT inhaler Inhale 1-2 puffs into the lungs every 6 (six) hours as needed for wheezing or  shortness of breath.    [provider]  amoxicillin (AMOXIL) 500 MG capsule Take 1 capsule (500 mg total) by mouth 3 (three) times daily. 07/20/19   Lorelee New, PA-C  ibuprofen (ADVIL) 800 MG tablet Take 1 tablet (800 mg total) by mouth 3 (three) times daily as needed for moderate pain. 07/20/19   Lorelee New, PA-C  montelukast (SINGULAIR) 4 MG chewable tablet Chew 4 mg by mouth at bedtime.    [provider]  Phenylephrine-DM-GG-APAP [facility] CHILD MULTI-SYMPTOM) 5-10-200-325 MG/10ML LIQD Take 30 mLs by mouth daily as needed (for congestion).    [provider]    Allergies    Patient has no known allergies.  Review of Systems   Review of Systems  All other systems reviewed and are negative. Ten systems reviewed and are negative for acute change, except as noted in the HPI.   Physical Exam Updated Vital Signs BP 105/71 (BP Location: Left Arm)   Pulse 72   Temp 98.5 F (36.9 C) (Oral)   Resp 12   SpO2 99%   Physical Exam Vitals and nursing note reviewed.  Constitutional:      General: He is not in acute distress.    Appearance: Normal appearance. He is not ill-appearing, toxic-appearing or diaphoretic.  HENT:     Head: Normocephalic and  atraumatic.     Right Ear: External ear normal.     Left Ear: External ear normal.     Nose: Nose normal.     Mouth/Throat:     Mouth: Mucous membranes are moist.     Pharynx: Oropharynx is clear. No oropharyngeal exudate or posterior oropharyngeal erythema.  Eyes:     Extraocular Movements: Extraocular movements intact.  Cardiovascular:     Rate and Rhythm: Normal rate and regular rhythm.     Pulses: Normal pulses.     Heart sounds: Normal heart sounds. No murmur heard. No friction rub. No gallop.   Pulmonary:     Effort: Pulmonary effort is normal. No respiratory distress.     Breath sounds: Normal breath sounds. No stridor. No wheezing, rhonchi or rales.  Abdominal:     General: Abdomen is flat.      Tenderness: There is no abdominal tenderness.  Musculoskeletal:        General: Normal range of motion.     Cervical back: Normal range of motion and neck supple. No tenderness.  Skin:    General: Skin is warm and dry.  Neurological:     General: No focal deficit present.     Mental Status: He is alert and oriented to person, place, and time.  Psychiatric:        Attention and Perception: Attention normal.        Mood and Affect: Mood is depressed. Affect is tearful.        Behavior: Behavior normal. Behavior is not agitated or aggressive.        Thought Content: Thought content is not paranoid or delusional. Thought content includes homicidal and suicidal ideation. Thought content includes homicidal and suicidal plan.     Comments: Does not appear to be responding to internal stimuli.    ED Results / Procedures / Treatments   Labs (all labs ordered are listed, but only abnormal results are displayed) Labs Reviewed  SALICYLATE LEVEL - Abnormal; Notable for the following components:      Result Value   Salicylate Lvl <7.0 (*)    All other components within normal limits  ACETAMINOPHEN LEVEL - Abnormal; Notable for the following components:   Acetaminophen (Tylenol), Serum <10 (*)    All other components within normal limits  RESP PANEL BY RT-PCR (FLU A&B, COVID) ARPGX2  COMPREHENSIVE METABOLIC PANEL  ETHANOL  CBC  RAPID URINE DRUG SCREEN, HOSP PERFORMED   EKG None  Radiology No results found.  Procedures Procedures   Medications Ordered in ED Medications - No data to display  ED Course  I have reviewed the triage vital signs and the nursing notes.  Pertinent labs & imaging results that were available during my care of the patient were reviewed by me and considered in my medical decision making (see chart for details).    MDM Rules/Calculators/A&P                          Patient is a 20 year old male with no significant medical history presents the emergency  department due to suicidal and homicidal ideation.  Lab work is reassuring.  Patient has no complaints besides SI/HI.  Does not appear to be responding to internal stimuli.  Feel that patient will likely meet inpatient criteria.  Awaiting TTS consultation.  Disposition pending TTS recommendations.  Final Clinical Impression(s) / ED Diagnoses Final diagnoses:  Suicidal ideation  Homicidal ideation    Rx /  DC Orders ED Discharge Orders    None       Placido Sou, Cordelia Poche 10/06/20 1812    Wynetta Fines, MD 10/11/20 2159

## 2020-10-07 ENCOUNTER — Inpatient Hospital Stay (HOSPITAL_COMMUNITY)
Admission: AD | Admit: 2020-10-07 | Discharge: 2020-10-14 | DRG: 885 | Disposition: A | Discharge status: Home / Self Care | Payer: BC Managed Care – PPO | Source: Intra-hospital | Attending: Psychiatry | Admitting: Psychiatry

## 2020-10-07 ENCOUNTER — Encounter (HOSPITAL_COMMUNITY): Payer: Self-pay | Admitting: Psychiatry

## 2020-10-07 ENCOUNTER — Other Ambulatory Visit: Payer: Self-pay | Admitting: Psychiatry

## 2020-10-07 DIAGNOSIS — Z79899 Other long term (current) drug therapy: Secondary | ICD-10-CM | POA: Diagnosis not present

## 2020-10-07 DIAGNOSIS — F1210 Cannabis abuse, uncomplicated: Secondary | ICD-10-CM | POA: Diagnosis present

## 2020-10-07 DIAGNOSIS — F332 Major depressive disorder, recurrent severe without psychotic features: Principal | ICD-10-CM | POA: Diagnosis present

## 2020-10-07 DIAGNOSIS — R45850 Homicidal ideations: Secondary | ICD-10-CM | POA: Diagnosis not present

## 2020-10-07 DIAGNOSIS — Z20822 Contact with and (suspected) exposure to covid-19: Secondary | ICD-10-CM | POA: Diagnosis present

## 2020-10-07 DIAGNOSIS — Z560 Unemployment, unspecified: Secondary | ICD-10-CM

## 2020-10-07 DIAGNOSIS — F329 Major depressive disorder, single episode, unspecified: Secondary | ICD-10-CM | POA: Diagnosis present

## 2020-10-07 DIAGNOSIS — R45851 Suicidal ideations: Secondary | ICD-10-CM | POA: Diagnosis not present

## 2020-10-07 MED ORDER — ACETAMINOPHEN 325 MG PO TABS: 650.0000 mg | ORAL_TABLET | Freq: Four times a day (QID) | ORAL | Status: DC | PRN

## 2020-10-07 MED ORDER — TRAZODONE HCL 50 MG PO TABS
50.0000 mg | ORAL_TABLET | Freq: Every evening | ORAL | Status: DC | PRN
  Filled 2020-10-07: qty 1

## 2020-10-07 MED ORDER — ENSURE ENLIVE PO LIQD
237.0000 mL | Freq: Two times a day (BID) | ORAL | Status: DC
  Administered 2020-10-08 – 2020-10-14 (×13): 237 mL via ORAL
  Filled 2020-10-07 (×17): qty 237

## 2020-10-07 MED ORDER — HYDROXYZINE HCL 25 MG PO TABS
25.0000 mg | ORAL_TABLET | Freq: Three times a day (TID) | ORAL | Status: DC | PRN
  Administered 2020-10-08: 25 mg via ORAL
  Filled 2020-10-07: qty 1

## 2020-10-07 NOTE — Progress Notes (Signed)
Pt was accepted to [facility] Rm 401-1  Meets inpatient criteria per [name], PA  The attending physician is Dr. Jola Babinski  Report can be called to [phone]   Patient is Voluntary.  Patient can be transported Psychologist, educational.   Patient is scheduled to arrive at [facility] at 12:30 pm.  RN, Jamey Ripa notified.

## 2020-10-07 NOTE — ED Provider Notes (Signed)
Emergency Medicine Observation Re-evaluation Note  Noah Stewart is a 20 y.o. male, seen on rounds today.  Pt initially presented to the ED for complaints of Suicidal and Homicidal Currently, the patient is awake, resting in bed, no needs at this time.  Physical Exam  BP 106/71 (BP Location: Left Arm)   Pulse 62   Temp 97.9 F (36.6 C) (Oral)   Resp 20   SpO2 100%  Physical Exam General: Alert, awake, oriented Lungs: Respirations even and unlabored Psych: calm, cooperative  ED Course / MDM  EKG:    I have reviewed the labs performed to date as well as medications administered while in observation.  Recent changes in the last 24 hours include awaiting placement.  Plan  Current plan is for placement. Patient is not under full IVC at this time.   Jeannie Fend, PA-C 10/07/20 1158    Lorre Nick, MD 10/08/20 [phone]

## 2020-10-07 NOTE — BHH Group Notes (Signed)
The focus of this group is to help patients review their daily goal of treatment and discuss progress on daily workbooks.Adult Psychoeducational Group Note  Date:  10/07/2020 Time:  10:25 PM  Group Topic/Focus:  Wrap-Up Group:   The focus of this group is to help patients review their daily goal of treatment and discuss progress on daily workbooks.  Participation Level:  Minimal  Participation Quality:  Attentive  Affect:  Appropriate  Cognitive:  Appropriate  Insight: Good  Engagement in Group:  Engaged  Modes of Intervention:  Discussion  Additional Comment  Jacalyn Lefevre 10/07/2020, 10:25 PM

## 2020-10-07 NOTE — ED Notes (Signed)
Patient was given a Snack and Drink. 

## 2020-10-07 NOTE — BH Assessment (Signed)
Per Paulla Dolly, RN no appropriate beds available. Disposition CSW to seek placement. Disposition discussed with Gabriel Rung, RN via secure chat in [place].      Redmond Pulling, MS, [facility] Triage Specialist [phone]

## 2020-10-07 NOTE — Tx Team (Signed)
Initial Treatment Plan 10/07/2020 4:37 PM Noah Stewart VOH:[id number]    PATIENT STRESSORS: Loss of Grandmother 1 month ago Marital or family conflict   PATIENT STRENGTHS: Wellsite geologist fund of knowledge Physical Health   PATIENT IDENTIFIED PROBLEMS: Depression  Aggression  Suicidal/homicidal ideation    "I want to feel like I have somebody in my life"  "Change my thoughts"           DISCHARGE CRITERIA:  Improved stabilization in mood, thinking, and/or behavior Need for constant or close observation no longer present Reduction of life-threatening or endangering symptoms to within safe limits Verbal commitment to aftercare and medication compliance  PRELIMINARY DISCHARGE PLAN: Outpatient therapy Medication management  PATIENT/FAMILY INVOLVEMENT: This treatment plan has been presented to and reviewed with the patient, Noah Stewart.  The patient and family have been given the opportunity to ask questions and make suggestions.  Levin Bacon, RN 10/07/2020, 4:37 PM

## 2020-10-07 NOTE — Progress Notes (Signed)
Addis is a 20 year old male being admitted voluntarily to [facility] from [facility].  He presented to the ED due to suicidal ideation, anger management issues and homicidal ideation towards his step-father.  His recent stressor is maternal grandmother passing away a month ago and argument wit his mother resulting in him being kicked out of the home.  He has no previous psychiatric history.  UDS positive for marijuana.  During [facility] admission, he reported depression had gotten worse since the passing of his grandmother.  He is now homeless since the altercation with his mother.  He doesn't feel like he has any supports.  Denies SI/HI at this time and will seek out staff if that changes.  Oriented him to the unit.  Admission paperwork completed and signed.  Belongings searched and no locker needed on admission.  Skin assessment completed and noted tattoos on left arm and right knee old star from stitches over 2 years ago.  No contraband found.  Q 15 minute checks initiated for safety.  We will monitor the progress towards his goals.

## 2020-10-08 DIAGNOSIS — F32A Depression, unspecified: Secondary | ICD-10-CM | POA: Insufficient documentation

## 2020-10-08 DIAGNOSIS — F1210 Cannabis abuse, uncomplicated: Secondary | ICD-10-CM | POA: Diagnosis not present

## 2020-10-08 DIAGNOSIS — F411 Generalized anxiety disorder: Secondary | ICD-10-CM | POA: Insufficient documentation

## 2020-10-08 DIAGNOSIS — F332 Major depressive disorder, recurrent severe without psychotic features: Secondary | ICD-10-CM | POA: Diagnosis not present

## 2020-10-08 DIAGNOSIS — F329 Major depressive disorder, single episode, unspecified: Secondary | ICD-10-CM | POA: Diagnosis present

## 2020-10-08 LAB — LIPID PANEL
Cholesterol: 173 mg/dL (ref 0–200)
HDL: 29 mg/dL — ABNORMAL LOW (ref >40)
LDL Cholesterol: 119 mg/dL — ABNORMAL HIGH (ref 0–99)
Total CHOL/HDL Ratio: 6 RATIO
Triglycerides: 124 mg/dL (ref <150)
VLDL: 25 mg/dL (ref 0–40)

## 2020-10-08 LAB — HEMOGLOBIN A1C
Hgb A1c MFr Bld: 5.1 % (ref 4.8–5.6)
Mean Plasma Glucose: 99.67 mg/dL

## 2020-10-08 LAB — TSH: TSH: 0.817 u[IU]/mL (ref 0.350–4.500)

## 2020-10-08 MED ORDER — SERTRALINE HCL 50 MG PO TABS
50.0000 mg | ORAL_TABLET | Freq: Every day | ORAL | Status: DC
  Filled 2020-10-08: qty 1

## 2020-10-08 MED ORDER — SERTRALINE HCL 25 MG PO TABS
25.0000 mg | ORAL_TABLET | Freq: Every day | ORAL | Status: DC
  Administered 2020-10-08 – 2020-10-11 (×4): 25 mg via ORAL
  Filled 2020-10-08 (×6): qty 1

## 2020-10-08 NOTE — Progress Notes (Signed)
[facility] MD Progress Note  10/09/2020 12:36 PM Noah Stewart  MRN:  [id number]   Chief Complaint: suicidal ideation and depression  Subjective:  Noah Stewart is a 20 y.o. male with no reported past psychiatric history, who was initially admitted for inpatient psychiatric hospitalization on 10/07/2020 for management of worsening depression, anger issues, and SI. The patient is currently on Hospital Day 2.   Chart Review from last 24 hours:  The patient's chart was reviewed and nursing notes were reviewed. The patient's case was discussed in multidisciplinary team meeting. Per nursing, the patient was visible in the milieu interacting with peers yesterday. He has denied SI, HI or AVH to staff yesterday but reported SI with thoughts to hang himself on the unit to nursing this morning. He has had no behavioral outbursts noted. Per [facility] he was compliant with scheduled medications. He did receive Vistaril X1 yesterday.  Information Obtained Today During Patient Interview: The patient was seen and evaluated on the unit. On assessment today the patient reports that this morning he got frustrated and "annoyed" when staff kept opening his door to do safety checks. He also states he is frustrated that "my medication is not working yet." He states that in the context of his frustration this morning, he had impulsive onset of SI and started trying to tie a loose sheet on his bed and look for a place to potentially hang himself in his room. When he realized that the linen could not be tied and that there were no ligature places in his room, he states he stopped and notified staff. He is ambivalent if he can contract for safety on the unit stating he is having trouble "controlling the bad thoughts." He states his mood is better when he is around peers and he is feeling lonely in his room today. He denies HI, AVH, paranoia, or manic/hypomanic symptoms. He states he feels anxious when he is alone. He has tolerated the  start of Zoloft but had 1 loose stool this morning and a mild HA this morning. He states his appetite has been low and his sleep was fair.   Principal Problem: MDD (major depressive disorder), recurrent severe, without psychosis (HCC) Diagnosis: Principal Problem:   MDD (major depressive disorder), recurrent severe, without psychosis (HCC) Active Problems:   Suicidal ideations   Marijuana abuse  Total Time Spent in Direct Patient Care:  I personally spent 30 minutes on the unit in direct patient care. The direct patient care time included face-to-face time with the patient, reviewing the patient's chart, communicating with other professionals, and coordinating care. Greater than 50% of this time was spent in counseling or coordinating care with the patient regarding goals of hospitalization, psycho-education, and discharge planning needs.  Past Psychiatric History: see admission H&P  Past Medical History:  Past Medical History:  Diagnosis Date  . Murmur    Family History:  Family History  Problem Relation Age of Onset  . Hypertension Mother   . Heart disease Maternal Grandmother   . Sudden death Maternal Grandmother    Family Psychiatric  History: see admission H&P  Social History:   Unemployed and not in school; Living recently with friends; Daily THC use  Sleep: Fair  Appetite:  Poor  Current Medications: Current Facility-Administered Medications  Medication Dose Route Frequency Provider Last Rate Last Admin  . acetaminophen (TYLENOL) tablet 650 mg  650 mg Oral Q6H PRN [name], NP      . feeding supplement (ENSURE ENLIVE /  ENSURE PLUS) liquid 237 mL  237 mL Oral BID BM Laveda AbbeParks, Laurie Britton, NP   237 mL at 10/09/20 [id number]  . hydrOXYzine (ATARAX/VISTARIL) tablet 25 mg  25 mg Oral TID PRN Laveda AbbeParks, Laurie Britton, NP   25 mg at 10/08/20 0755  . sertraline (ZOLOFT) tablet 25 mg  25 mg Oral QHS Laveda AbbeParks, Laurie Britton, NP   25 mg at 10/08/20 2129  . traZODone (DESYREL)  tablet 50 mg  50 mg Oral QHS PRN,MR X 1 Laveda AbbeParks, Laurie Britton, NP        Lab Results:  Results for orders placed or performed during the hospital encounter of 10/07/20 (from the past 48 hour(s))  Lipid panel     Status: Abnormal   Collection Time: 10/08/20  6:07 PM  Result Value Ref Range   Cholesterol 173 0 - 200 mg/dL   Triglycerides 960124 <454<150 mg/dL   HDL 29 (L) >09>40 mg/dL   Total CHOL/HDL Ratio 6.0 RATIO   VLDL 25 0 - 40 mg/dL   LDL Cholesterol 811119 (H) 0 - 99 mg/dL    Comment:        Total Cholesterol/HDL:CHD Risk Coronary Heart Disease Risk Table                     Men   Women  1/2 Average Risk   3.4   3.3  Average Risk       5.0   4.4  2 X Average Risk   9.6   7.1  3 X Average Risk  23.4   11.0        Use the calculated Patient Ratio above and the CHD Risk Table to determine the patient's CHD Risk.        ATP III CLASSIFICATION (LDL):  <100     mg/dL   Optimal  [id number]  mg/dL   Near or Above                    Optimal  130-159  mg/dL   Borderline  [id number]  mg/dL   High  >086>190     mg/dL   Very High Performed at [facility], [address] [place], [place] [address]   TSH     Status: None   Collection Time: 10/08/20  6:07 PM  Result Value Ref Range   TSH 0.817 0.350 - 4.500 uIU/mL    Comment: Performed by a 3rd Generation assay with a functional sensitivity of <=0.01 uIU/mL. Performed at [facility], [address] [place], [place] [address]   Hemoglobin A1c     Status: None   Collection Time: 10/08/20  6:07 PM  Result Value Ref Range   Hgb A1c MFr Bld 5.1 4.8 - 5.6 %    Comment: (NOTE) Pre diabetes:          5.7%-6.4%  Diabetes:              >6.4%  Glycemic control for   <7.0% adults with diabetes    Mean Plasma Glucose 99.67 mg/dL    Comment: Performed at [facility], [address] [place], [place] [address]    Blood Alcohol level:  Lab Results  Component Value Date   [facility]2020   Psychiatric  Specialty Exam: Physical Exam Vitals reviewed.  HENT:     Head: Normocephalic.  Pulmonary:     Effort: Pulmonary effort is normal.  Neurological:     Mental Status:  He is alert.     Review of Systems  Respiratory: Negative for shortness of breath.   Cardiovascular: Negative for chest pain.  Gastrointestinal: Positive for diarrhea. Negative for abdominal pain, nausea and vomiting.  Neurological: Positive for headaches.    Blood pressure 112/62, pulse 76, temperature 97.8 F (36.6 C), temperature source Oral, resp. rate 16, height 6' (1.829 m), weight 75.8 kg, SpO2 100 %.Body mass index is 22.65 kg/m.  General Appearance: casually dressed, adequate hygiene, appears stated age  Eye Contact:  Minimal  Speech:  Clear and Coherent and Normal Rate  Volume:  Decreased  Mood:  Anxious and Depressed  Affect:  Constricted  Thought Process:  Goal Directed  Orientation:  Full (Time, Place, and Person)  Thought Content:  Denies AVH, paranoia, or delusions; no acute psychosis on exam  Suicidal Thoughts:  Yes - with thoughts to hang himself  Homicidal Thoughts:  Denied  Memory:  Immediate;   Good  Judgement:  Impaired  Insight:  Lacking  Psychomotor Activity:  Normal  Concentration:  Concentration: Fair and Attention Span: Fair  Recall:  [employer] of Knowledge:  Fair  Language:  Good  Akathisia:  Negative  Assets:  Communication Skills Desire for Improvement Resilience Social Support  ADL's:  Intact  Cognition:  WNL  Sleep:  Number of Hours: 5.25   Physical Findings: AIMS: Facial and Oral Movements Muscles of Facial Expression: None, normal Lips and Perioral Area: None, normal Jaw: None, normal Tongue: None, normal,Extremity Movements Upper (arms, wrists, hands, fingers): None, normal Lower (legs, knees, ankles, toes): None, normal, Trunk Movements Neck, shoulders, hips: None, normal, Overall Severity Severity of abnormal movements (highest score from questions above): None,  normal Incapacitation due to abnormal movements: None, normal Patient's awareness of abnormal movements (rate only patient's report): No Awareness, Dental Status Current problems with teeth and/or dentures?: No Does patient usually wear dentures?: No     Treatment Plan Summary: Diagnoses / Active Problems: MDD recurrent severe without psychotic features (r/o substance induced depressive d/o; r/o IED) Cannabis use d/o  PLAN: 1. Safety and Monitoring:  -- Voluntary admission to inpatient psychiatric unit for safety, stabilization and treatment  -- Daily contact with patient to assess and evaluate symptoms and progress in treatment  -- Patient's case to be discussed in multi-disciplinary team meeting  -- Observation Level : changed to 1:1  -- Vital signs:  q12 hours  -- Precautions: suicide  2. Psychiatric Diagnoses and Treatment:  MDD recurrent severe without psychotic features (r/o substance induced depressive d/o; r/o IED) -- Continue Zoloft 25mg  daily- titrating up as tolerated -- Patient placed on 1:1 for safety given active SI today with plan -- Would benefit from CBT/psychotherapy after discharge; may benefit from [facility] after discharge  -- Encouraged patient to participate in unit milieu and in scheduled group therapies   -- Short Term Goals: Ability to identify changes in lifestyle to reduce recurrence of condition will improve and Ability to demonstrate self-control will improve  -- Long Term Goals: Improvement in symptoms so as ready for discharge   Cannabis use d/o  -- Discussed the need for abstinence from illicit substances after discharge  -- Short Term Goals: Ability to identify triggers associated with substance abuse/mental health issues will improve  -- Long Term Goals: Improvement in symptoms so as ready for discharge   3. Medical Issues Being Addressed:   Additional admission labs reviewed:  -- Hemoglobin A1c 5.1  -- TSH 0.817  -- Lipid panel: cholesterol 173,  triglycerides 124, HDL 29, LDL 119   4. Discharge Planning:   -- Social work and case management to assist with discharge planning and identification of hospital follow-up needs prior to discharge  -- Estimated LOS: 3-4 days  -- Discharge Concerns: Need to establish a safety plan; Medication compliance and effectiveness  -- Discharge Goals: Return home with outpatient referrals for mental health follow-up including medication management/psychotherapy  [name], MD, FAPA 10/09/2020, 12:36 PM

## 2020-10-08 NOTE — Progress Notes (Signed)
EKG completed and given to NP for review.

## 2020-10-08 NOTE — Tx Team (Signed)
Interdisciplinary Treatment and Diagnostic Plan Update  10/08/2020 Time of Session: 9:10am  Noah Stewart MRN: [id number]  Principal Diagnosis: <principal problem not specified>  Secondary Diagnoses: Active Problems:   Suicidal ideations   Current Medications:  Current Facility-Administered Medications  Medication Dose Route Frequency Provider Last Rate Last Admin  . acetaminophen (TYLENOL) tablet 650 mg  650 mg Oral Q6H PRN Ethelene Hal, NP      . feeding supplement (ENSURE ENLIVE / ENSURE PLUS) liquid 237 mL  237 mL Oral BID BM Ethelene Hal, NP   237 mL at 10/08/20 0914  . hydrOXYzine (ATARAX/VISTARIL) tablet 25 mg  25 mg Oral TID PRN Ethelene Hal, NP   25 mg at 10/08/20 0755  . traZODone (DESYREL) tablet 50 mg  50 mg Oral QHS PRN,MR X 1 Ethelene Hal, NP       PTA Medications: Medications Prior to Admission  Medication Sig Dispense Refill Last Dose  . albuterol (PROVENTIL HFA;VENTOLIN HFA) 108 (90 BASE) MCG/ACT inhaler Inhale 1-2 puffs into the lungs as needed for wheezing or shortness of breath.     Marland Kitchen ibuprofen (ADVIL) 800 MG tablet Take 1 tablet (800 mg total) by mouth 3 (three) times daily as needed for moderate pain. (Patient not taking: No sig reported) 21 tablet 0     Patient Stressors: Loss of Grandmother 1 month ago Marital or family conflict  Patient Strengths: Curator fund of knowledge Physical Health  Treatment Modalities: Medication Management, Group therapy, Case management,  1 to 1 session with clinician, Psychoeducation, Recreational therapy.   Physician Treatment Plan for Primary Diagnosis: <principal problem not specified> Long Term Goal(s):     Short Term Goals:    Medication Management: Evaluate patient's response, side effects, and tolerance of medication regimen.  Therapeutic Interventions: 1 to 1 sessions, Unit Group sessions and Medication administration.  Evaluation of Outcomes: Not  Met  Physician Treatment Plan for Secondary Diagnosis: Active Problems:   Suicidal ideations  Long Term Goal(s):     Short Term Goals:       Medication Management: Evaluate patient's response, side effects, and tolerance of medication regimen.  Therapeutic Interventions: 1 to 1 sessions, Unit Group sessions and Medication administration.  Evaluation of Outcomes: Not Met   RN Treatment Plan for Primary Diagnosis: <principal problem not specified> Long Term Goal(s): Knowledge of disease and therapeutic regimen to maintain health will improve  Short Term Goals: Ability to remain free from injury will improve, Ability to demonstrate self-control, Ability to participate in decision making will improve, Ability to verbalize feelings will improve, Ability to disclose and discuss suicidal ideas and Ability to identify and develop effective coping behaviors will improve  Medication Management: RN will administer medications as ordered by provider, will assess and evaluate patient's response and provide education to patient for prescribed medication. RN will report any adverse and/or side effects to prescribing provider.  Therapeutic Interventions: 1 on 1 counseling sessions, Psychoeducation, Medication administration, Evaluate responses to treatment, Monitor vital signs and CBGs as ordered, Perform/monitor CIWA, COWS, AIMS and Fall Risk screenings as ordered, Perform wound care treatments as ordered.  Evaluation of Outcomes: Not Met   LCSW Treatment Plan for Primary Diagnosis: <principal problem not specified> Long Term Goal(s): Safe transition to appropriate next level of care at discharge, Engage patient in therapeutic group addressing interpersonal concerns.  Short Term Goals: Engage patient in aftercare planning with referrals and resources, Increase social support, Increase emotional regulation, Facilitate acceptance of mental health  diagnosis and concerns, Identify triggers associated  with mental health/substance abuse issues and Increase skills for wellness and recovery  Therapeutic Interventions: Assess for all discharge needs, 1 to 1 time with Social worker, Explore available resources and support systems, Assess for adequacy in community support network, Educate family and significant other(s) on suicide prevention, Complete Psychosocial Assessment, Interpersonal group therapy.  Evaluation of Outcomes: Not Met   Progress in Treatment: Attending groups: Yes. Participating in groups: Yes. Taking medication as prescribed: Yes. Toleration medication: Yes. Family/Significant other contact made: No, will contact:  If consents are given  Patient understands diagnosis: No. Discussing patient identified problems/goals with staff: Yes. Medical problems stabilized or resolved: Yes. Denies suicidal/homicidal ideation: Yes. Issues/concerns per patient self-inventory: No.   New problem(s) identified: No, Describe:  None   New Short Term/Long Term Goal(s): medication stabilization, elimination of SI thoughts, development of comprehensive mental wellness plan.   Patient Goals:  "To go home"   Discharge Plan or Barriers: Patient recently admitted. CSW will continue to follow and assess for appropriate referrals and possible discharge planning.   Reason for Continuation of Hospitalization: Depression Homicidal ideation Medication stabilization Suicidal ideation  Estimated Length of Stay: 3 to 5 days   Attendees: Patient: Noah Stewart  10/08/2020   Physician: Ethelene Browns, MD  10/08/2020   Nursing:  10/08/2020   RN Care Manager: 10/08/2020   Social Worker: Verdis Frederickson, [place] 10/08/2020   Recreational Therapist:  10/08/2020   Other:  10/08/2020   Other:  10/08/2020   Other: 10/08/2020     Scribe for Treatment Team: Darleen Crocker, [place] 10/08/2020 9:33 AM

## 2020-10-08 NOTE — H&P (Signed)
Psychiatric Admission Assessment Adult  Patient Identification: Noah Stewart MRN:  [id number] Date of Evaluation:  10/08/2020 Chief Complaint:  Suicidal ideations [R45.851] Principal Diagnosis: MDD (major depressive disorder) Diagnosis:  Principal Problem:   MDD (major depressive disorder) Active Problems:   Suicidal ideations  History of Present Illness: (From TTS [facility] note): Noah Stewart is a 20 year old male who presents voluntary and accompanied to [facility]. Clinician asked the pt, "what brought you to the hospital?" Pt reported, his father felt like he needed help because their conversations escalate quickly. Pt reported, he's been flipping out (verbally, breaking is phone, punching walls.) Pt reported, about a month ago, his maternal grandmother passed away and shortly after he got into an argument with his mother and was kicked out of her home. Pt reported, he was told he punched walls but doesn't remember doing it. Pt reported, he blacks out at times. Pt reported, when his mother kicked him out he grew a big hatred for her. Pt reported, "I'm going to hurt myself or others." Pt reported, he's suicidal, he has a lot of different thoughts, plans of strangulation, shooting himself. Pt reported, when he's suicidal he's homicidal. Pt reported, he rather kill his stepfather so his mother can feel how he feels. Pt reported, he feel his depression has worsened. Pt reported, "I don't want to be here no more." Pt reported, feeling someone is out to get him. Pt denies, access to weapons.   Pt reports, marijuana use two weeks ago but doesn't use often. Pt's UDS is positive for marijuana. Pt denies, being linked to OPT resources (medication management and/or counseling.) Pt denies, previous inpatient admissions.   Pt presents quiet, awake in scrubs with normal speech. Pt's eye contact was poor pt looked down mostly during the assessment. Pt's mood was depressed. Pt's affect was depressed, flat. Pt's  thought process was appropriate to mood and circumstances. Pt reported, if discharged from [facility] he can contract for safety.   *Pt consented for clinician to contact his father Jaymond Waage, [phone]) to obtain additional information. Per father, the pt has been having a rough times over the last few weeks. Per father, the pt loss his maternal great-grandmother, he was kicked out of his mothers' house in the middle of the night, breakup with girlfriend. Pt's father reported, the pt can be verbally and physically aggressive he has not been physically aggressive towards him. Per father, the pt was going to leave him a letter that is when he discussed getting help with the pt. Pt's father reported, it worries him if the pt were to be discharged after the pt gets the help he needs he will have the pt move back in with him.*  Evaluation on the unit today: Patient was seen, chart reviewed and case discussed with the treatment team. Patient is calm and cooperative. He appears sad and depressed with a flat affect. Patient stated he has been "flipping out" a lot lately and has had increased suicidal thoughts.  He stated he has thoughts about overdosing on pills. He stated he attempted to overdose on ibuprofen about 3 months ago but immediately threw up. He told his girlfriend and she has been keeping a closer eye on him.  He reported trying to keep his distance from his girlfriend so he won't flip out on her. He denies he has ever put hands on her. He feels homicidal toward his step-father but no plan. He stated "my Mom hurt me when she kicked me  out, I would never hurt her, but if I hurt him, it would hurt her. Then she would know how I feel." He apparently flipped out on his mother and punched holes in the wall. He has been staying with friends for the past 3 weeks. He stated his father was taking him to a job interview and said something that triggered him and he "flipped out" on him. His father talked him into  coming to the hospital to get help.   He will be able to live with his father when he is discharged. His stressors include not having a job, no car, He stated he remembers feeling depressed and angry since age 41. He reported getting into trouble at school for arguing with teachers. He stated he feels hopeless, helpless, guilty, irritable, angry anxious and has difficulty falling and staying asleep. He reported getting about 4 hours per night. He reported a 20 lb weight loss in the past 6 months. He worries about everything.   He has difficulty making friends. He feels his parents are not trying to help him get his life going. He is not working and does not have a car. He stated he is trying to find a job. Of note, his maternal grandmother passed away recently, he was very close to her. His paternal grandfather is living, he is close to him. His parents have been divorced since he was age 62. He has an older brother, age 25. He primarily lived with his father and his brother lived with their mother. He moved in with his mother in his sophomore year. He does not get along with his step-father.  He endorses daily use of marijuana, UDS is positive for THC. He denies alcohol use. He denies legal problems. He has no physical complaints and has never been hospitalized. He takes no medications, has never seen a therapist or psychiatrist. He has no allergies to medications.   Associated Signs/Symptoms: Depression Symptoms:  depressed mood, fatigue, feelings of worthlessness/guilt, difficulty concentrating, hopelessness, suicidal thoughts with specific plan, anxiety, loss of energy/fatigue, disturbed sleep, weight loss, decreased appetite, Duration of Depression Symptoms: Greater than two weeks  (Hypo) Manic Symptoms:  None Anxiety Symptoms:  Excessive Worry, Social Anxiety, Psychotic Symptoms:  Hallucinations: None, denies PTSD Symptoms: NA denies history of violence, trauma and abuse Total Time  spent with patient: 1 hour  Past Psychiatric History: None  Is the patient at risk to self? Yes.    Has the patient been a risk to self in the past 6 months? No.  Has the patient been a risk to self within the distant past? No.  Is the patient a risk to others? Yes.    Has the patient been a risk to others in the past 6 months? No.  Has the patient been a risk to others within the distant past? No.   Prior Inpatient Therapy:  None Prior Outpatient Therapy:  None  Alcohol Screening: 1. How often do you have a drink containing alcohol?: Never 2. How many drinks containing alcohol do you have on a typical day when you are drinking?: 1 or 2 3. How often do you have six or more drinks on one occasion?: Never AUDIT-C Score: 0 9. Have you or someone else been injured as a result of your drinking?: No 10. Has a relative or friend or a doctor or another health worker been concerned about your drinking or suggested you cut down?: No Alcohol Use Disorder Identification Test Final Score (AUDIT): 0  Alcohol Brief Interventions/Follow-up: AUDIT Score <7 follow-up not indicated Substance Abuse History in the last 12 months:  Yes.   Consequences of Substance Abuse: NA Previous Psychotropic Medications: No  Psychological Evaluations: No  Past Medical History:  Past Medical History:  Diagnosis Date  . Murmur    History reviewed. No pertinent surgical history. Family History:  Family History  Problem Relation Age of Onset  . Hypertension Mother   . Heart disease Maternal Grandmother   . Sudden death Maternal Grandmother    Family Psychiatric  History: Paternal Uncle has some mental health problem, unsure what  Tobacco Screening: Have you used any form of tobacco in the last 30 days? (Cigarettes, Smokeless Tobacco, Cigars, and/or Pipes): No Social History:  Social History   Substance and Sexual Activity  Alcohol Use No     Social History   Substance and Sexual Activity  Drug Use Not  Currently    Additional Social History: Marital status: Single (Pt reports having a recent break-up with a girlfriend) Are you sexually active?: Yes What is your sexual orientation?: Heterosexual Has your sexual activity been affected by drugs, alcohol, medication, or emotional stress?: No Does patient have children?: No   Allergies:  No Known Allergies Lab Results:  Results for orders placed or performed during the hospital encounter of 10/06/20 (from the past 48 hour(s))  Comprehensive metabolic panel     Status: None   Collection Time: 10/06/20  4:01 PM  Result Value Ref Range   Sodium 137 135 - 145 mmol/L   Potassium 4.8 3.5 - 5.1 mmol/L   Chloride 105 98 - 111 mmol/L   CO2 25 22 - 32 mmol/L   Glucose, Bld 80 70 - 99 mg/dL    Comment: Glucose reference range applies only to samples taken after fasting for at least 8 hours.   BUN 8 6 - 20 mg/dL   Creatinine, Ser 1.61 0.61 - 1.24 mg/dL   Calcium 9.4 8.9 - 09.6 mg/dL   Total Protein 7.9 6.5 - 8.1 g/dL   Albumin 4.2 3.5 - 5.0 g/dL   AST 16 15 - 41 U/L   ALT 12 0 - 44 U/L   Alkaline Phosphatase 40 38 - 126 U/L   Total Bilirubin 1.1 0.3 - 1.2 mg/dL   GFR, Estimated >04 >54 mL/min    Comment: (NOTE) Calculated using the CKD-EPI Creatinine Equation (2021)    Anion gap 7 5 - 15    Comment: Performed at [facility], [address] [place], [place] [address]  Ethanol     Status: None   Collection Time: 10/06/20  4:01 PM  Result Value Ref Range   Alcohol, Ethyl (B) <10 <10 mg/dL    Comment: (NOTE) Lowest detectable limit for serum alcohol is 10 mg/dL.  For medical purposes only. Performed at [facility], [address], [place], [place] [address]   Salicylate level     Status: Abnormal   Collection Time: 10/06/20  4:01 PM  Result Value Ref Range   Salicylate Lvl <7.0 (L) 7.0 - 30.0 mg/dL    Comment: Performed at [facility], [address], [place], [place] [address]  Acetaminophen level     Status:  Abnormal   Collection Time: 10/06/20  4:01 PM  Result Value Ref Range   Acetaminophen (Tylenol), Serum <10 (L) 10 - 30 ug/mL    Comment: (NOTE) Therapeutic concentrations vary significantly. A range of 10-30 ug/mL  may be an effective  concentration for many patients. However, some  are best treated at concentrations outside of this range. Acetaminophen concentrations >150 ug/mL at 4 hours after ingestion  and >50 ug/mL at 12 hours after ingestion are often associated with  toxic reactions.  Performed at [facility], [address] [place], [place] [address]   cbc     Status: None   Collection Time: 10/06/20  4:01 PM  Result Value Ref Range   WBC 5.1 4.0 - 10.5 K/uL   RBC 5.54 4.22 - 5.81 MIL/uL   Hemoglobin 16.0 13.0 - 17.0 g/dL   HCT 29.5 28.4 - 13.2 %   MCV 89.5 80.0 - 100.0 fL   MCH 28.9 26.0 - 34.0 pg   MCHC 32.3 30.0 - 36.0 g/dL   RDW 44.0 10.2 - 72.5 %   Platelets 275 150 - 400 K/uL   nRBC 0.0 0.0 - 0.2 %    Comment: Performed at [facility], [address], [place], [place] [address]  Resp Panel by RT-PCR (Flu A&B, Covid) Nasopharyngeal Swab     Status: None   Collection Time: 10/06/20  6:55 PM   Specimen: Nasopharyngeal Swab; Nasopharyngeal(NP) swabs in vial transport medium  Result Value Ref Range   SARS Coronavirus 2 by RT PCR NEGATIVE NEGATIVE    Comment: (NOTE) SARS-CoV-2 target nucleic acids are NOT DETECTED.  The SARS-CoV-2 RNA is generally detectable in upper respiratory specimens during the acute phase of infection. The lowest concentration of SARS-CoV-2 viral copies this assay can detect is 138 copies/mL. A negative result does not preclude SARS-Cov-2 infection and should not be used as the sole basis for treatment or other patient management decisions. A negative result may occur with  improper specimen collection/handling, submission of specimen other than nasopharyngeal swab, presence of viral mutation(s) within the areas targeted by  this assay, and inadequate number of viral copies(<138 copies/mL). A negative result must be combined with clinical observations, patient history, and epidemiological information. The expected result is Negative.  Fact Sheet for Patients:  BloggerCourse.com  Fact Sheet for Healthcare Providers:  SeriousBroker.it  This test is no t yet approved or cleared by the [place] FDA and  has been authorized for detection and/or diagnosis of SARS-CoV-2 by FDA under an Emergency Use Authorization (EUA). This EUA will remain  in effect (meaning this test can be used) for the duration of the COVID-19 declaration under Section 564(b)(1) of the Act, 21 U.S.C.section 360bbb-3(b)(1), unless the authorization is terminated  or revoked sooner.       Influenza A by PCR NEGATIVE NEGATIVE   Influenza B by PCR NEGATIVE NEGATIVE    Comment: (NOTE) The Xpert Xpress SARS-CoV-2/FLU/RSV plus assay is intended as an aid in the diagnosis of influenza from Nasopharyngeal swab specimens and should not be used as a sole basis for treatment. Nasal washings and aspirates are unacceptable for Xpert Xpress SARS-CoV-2/FLU/RSV testing.  Fact Sheet for Patients: BloggerCourse.com  Fact Sheet for Healthcare Providers: SeriousBroker.it  This test is not yet approved or cleared by the [place] FDA and has been authorized for detection and/or diagnosis of SARS-CoV-2 by FDA under an Emergency Use Authorization (EUA). This EUA will remain in effect (meaning this test can be used) for the duration of the COVID-19 declaration under Section 564(b)(1) of the Act, 21 U.S.C. section 360bbb-3(b)(1), unless the authorization is terminated or revoked.  Performed at [facility], [address] [place], [place] [address]   Rapid urine drug  screen (hospital performed)     Status: Abnormal   Collection Time:  10/06/20  9:36 PM  Result Value Ref Range   Opiates NONE DETECTED NONE DETECTED   Cocaine NONE DETECTED NONE DETECTED   Benzodiazepines NONE DETECTED NONE DETECTED   Amphetamines NONE DETECTED NONE DETECTED   Tetrahydrocannabinol POSITIVE (A) NONE DETECTED   Barbiturates NONE DETECTED NONE DETECTED    Comment: (NOTE) DRUG SCREEN FOR MEDICAL PURPOSES ONLY.  IF CONFIRMATION IS NEEDED FOR ANY PURPOSE, NOTIFY LAB WITHIN 5 DAYS.  LOWEST DETECTABLE LIMITS FOR URINE DRUG SCREEN Drug Class                     Cutoff (ng/mL) Amphetamine and metabolites    1000 Barbiturate and metabolites    200 Benzodiazepine                 200 Tricyclics and metabolites     300 Opiates and metabolites        300 Cocaine and metabolites        300 THC                            50 Performed at [facility], [address] [place], [place] [address]     Blood Alcohol level:  Lab Results  Component Value Date   ETH <10 10/06/2020    Metabolic Disorder Labs:  No results found for: HGBA1C, MPG No results found for: PROLACTIN No results found for: CHOL, TRIG, HDL, CHOLHDL, VLDL, LDLCALC  Current Medications: Current Facility-Administered Medications  Medication Dose Route Frequency Provider Last Rate Last Admin  . acetaminophen (TYLENOL) tablet 650 mg  650 mg Oral Q6H PRN Laveda AbbeParks, Quintavius Niebuhr Britton, NP      . feeding supplement (ENSURE ENLIVE / ENSURE PLUS) liquid 237 mL  237 mL Oral BID BM Laveda AbbeParks, Kamaron Deskins Britton, NP   237 mL at 10/08/20 0914  . hydrOXYzine (ATARAX/VISTARIL) tablet 25 mg  25 mg Oral TID PRN Laveda AbbeParks, Dalani Mette Britton, NP   25 mg at 10/08/20 0755  . traZODone (DESYREL) tablet 50 mg  50 mg Oral QHS PRN,MR X 1 Laveda AbbeParks, Obediah Welles Britton, NP       PTA Medications: Medications Prior to Admission  Medication Sig Dispense Refill Last Dose  . albuterol (PROVENTIL HFA;VENTOLIN HFA) 108 (90 BASE) MCG/ACT inhaler Inhale 1-2 puffs into the lungs as needed for wheezing or shortness of breath.     Marland Kitchen.  ibuprofen (ADVIL) 800 MG tablet Take 1 tablet (800 mg total) by mouth 3 (three) times daily as needed for moderate pain. (Patient not taking: No sig reported) 21 tablet 0     Musculoskeletal: Strength & Muscle Tone: within normal limits Gait & Station: normal Patient leans: N/A  Psychiatric Specialty Exam:  Presentation  General Appearance: Appropriate for Environment; Casual; Fairly Groomed  Eye Contact:Good  Speech:Clear and Coherent; Normal Rate  Speech Volume:Normal  Handedness:Right  Mood and Affect  Mood:Depressed; Anxious; Dysphoric  Affect:Appropriate; Congruent; Depressed  Thought Process  Thought Processes:Coherent; Linear  Duration of Psychotic Symptoms: No data recorded Past Diagnosis of Schizophrenia or Psychoactive disorder: No data recorded Descriptions of Associations:Intact  Orientation:Full (Time, Place and Person)  Thought Content:Logical  Hallucinations:Hallucinations: None  Ideas of Reference:None  Suicidal Thoughts:Suicidal Thoughts: Yes, Active SI Active Intent and/or Plan: Without Intent; Without Plan  Homicidal Thoughts:Homicidal Thoughts: Yes, Active HI Active Intent and/or Plan: Without Intent; Without Plan  Sensorium  Memory:Immediate  Fair; Recent Fair; Remote Fair  Judgment:Fair  Insight:Fair  Executive Functions  Concentration:Fair  Attention Span:Fair  Recall:Fair  [employer] of Knowledge:Good  Language:Good  Psychomotor Activity  Psychomotor Activity:Psychomotor Activity: Normal  Assets  Assets:Communication Skills; Desire for Improvement; Leisure Time; Physical Health; Resilience; Social Support; Health and safety inspector; Housing  Sleep  Sleep:Sleep: Fair (complains of difficulty falling and staying asleep) Number of Hours of Sleep: 6.5  Physical Exam: Physical Exam Constitutional:      Appearance: Normal appearance.  HENT:     Head: Normocephalic.  Pulmonary:     Effort: Pulmonary effort is normal.   Musculoskeletal:        General: Normal range of motion.     Cervical back: Normal range of motion.  Neurological:     Mental Status: He is alert and oriented to person, place, and time.    Review of Systems  Constitutional: Negative.   Gastrointestinal: Negative.   Genitourinary: Negative.   Musculoskeletal: Negative.   Neurological: Negative.    Blood pressure 120/72, pulse 68, temperature 98.2 F (36.8 C), temperature source Oral, resp. rate 16, height 6' (1.829 m), weight 75.8 kg, SpO2 100 %. Body mass index is 22.65 kg/m.  Treatment Plan Summary: Daily contact with patient to assess and evaluate symptoms and progress in treatment and Medication management   1. Admit for crisis management and stabilization, estimated length of stay 3-5 days.    2. Medication management to reduce current symptoms to base line and improve the patient's overall level of functioning: See MAR, Md's SRA & treatment plan.   Observation Level/Precautions:  15 minute checks  Laboratory:  Reviewed, will order TSH, A1c, Lipid panel and EKG  Psychotherapy:  Group therapy  Medications:  See MAR  Consultations:  TBD  Discharge Concerns:  Safety, mood stability  Estimated LOS: 5-7 days  Other:     Physician Treatment Plan for Primary Diagnosis: MDD (major depressive disorder) Long Term Goal(s): Improvement in symptoms so as ready for discharge  Short Term Goals: Ability to identify changes in lifestyle to reduce recurrence of condition will improve, Ability to disclose and discuss suicidal ideas and Ability to identify and develop effective coping behaviors will improve  Physician Treatment Plan for Secondary Diagnosis: Principal Problem:   MDD (major depressive disorder) Active Problems:   Suicidal ideations  Long Term Goal(s): Improvement in symptoms so as ready for discharge  Short Term Goals: Ability to identify changes in lifestyle to reduce recurrence of condition will improve, Ability to  verbalize feelings will improve, Ability to disclose and discuss suicidal ideas, Ability to demonstrate self-control will improve, Ability to identify and develop effective coping behaviors will improve and Compliance with prescribed medications will improve  I certify that inpatient services furnished can reasonably be expected to improve the patient's condition.    [name], NP 3/16/20222:46 PM

## 2020-10-08 NOTE — Progress Notes (Signed)
Recreation Therapy Notes ° °Date: 3.16.22 °Time: 0930 °Location: [address] Dayroom ° °Group Topic: Stress Management  ° °Goal Area(s) Addresses:  °Patient will actively participate in stress management techniques presented during session.  ° °Behavioral Response: Engaged ° °Intervention: Stress management  ° °Activity :Guided Imagery ° °LRT read a script which had patients to envision what would be a peaceful place to them.  LRT asked patients to envision the sights, sounds, smells, temperature, etc of the what their peaceful place offered.  Patients were to listen and follow as script was read to fully engage in the activity. ° °Education:  Stress Management, Discharge Planning.  ° °Education Outcome: Acknowledges education ° °Clinical Observations/Feedback: Patient attended and participated in group activity. ° ° ° °Shakeira Rhee, LRT/CTRS ° ° ° ° ° ° ° ° °Thaxton Pelley A °10/08/2020 12:44 PM °

## 2020-10-08 NOTE — Progress Notes (Signed)
The patient's positive event for the day is that he did not have any bad thoughts today. His goal for tomorrow is to be happier.

## 2020-10-08 NOTE — Plan of Care (Signed)
Nurse discussed anxiety, depression and coping skills with patient.  

## 2020-10-08 NOTE — Progress Notes (Signed)
D: Patient presents with sad affect at time of assessment. Patient reports having an okay day. Patient is positive for passive SI at this time but verbally contracts for safety. Patient denies HI/AH/VH at this time.  A: Provided positive reinforcement and encouragement.  R: patient cooperative and receptive to efforts. Patient remains safe on the unit.   10/07/20 2131  Psych Admission Type (Psych Patients Only)  Admission Status Voluntary  Psychosocial Assessment  Patient Complaints Anxiety;Sadness;Depression  Eye Contact Brief  Facial Expression Sad;Sullen  Affect Sad  Speech Logical/coherent  Interaction Assertive  Motor Activity Other (Comment) (WDL)  Appearance/Hygiene Unremarkable  Behavior Characteristics Cooperative;Appropriate to situation  Mood Depressed;Sad  Thought Process  Coherency WDL  Content WDL  Delusions None reported or observed  Perception WDL  Hallucination None reported or observed  Judgment Impaired  Confusion None  Danger to Self  Current suicidal ideation? Denies  Danger to Others  Danger to Others None reported or observed

## 2020-10-08 NOTE — Progress Notes (Signed)
Nutrition Brief Note  Patient identified on the Malnutrition Screening Tool (MST) Report  No recent weight loss noted in records. Pt has been ordered Ensure. No other nutritional needs identified.  Wt Readings from Last 15 Encounters:  10/07/20 75.8 kg  02/25/20 72.6 kg  11/08/19 77.1 kg (71 %, Z= 0.54)*  07/20/19 77.6 kg (73 %, Z= 0.61)*  09/24/13 86.5 kg (>99 %, Z= 2.43)*  09/20/13 85.3 kg (>99 %, Z= 2.38)*   * Growth percentiles are based on CDC (Boys, 2-20 Years) data.    Body mass index is 22.65 kg/m. Patient meets criteria for normal based on current BMI.   Current diet order is regular.  No nutrition interventions warranted at this time. If nutrition issues arise, please consult RD.   Tilda Franco, MS, RD, LDN Inpatient Clinical Dietitian Contact information available via Amion

## 2020-10-08 NOTE — Progress Notes (Signed)
D:  Patient's self inventory sheet, patient has fair sleep, sleep medication not helpful.  Fair appetite, normal energy level, poor concentration.  Rated depression 7, hopeless 6, anxiety 9.  Withdrawals, chilling, cramping, agitation.  SI, contracts for safety, no plan.  Denied physical pain.  Goal is focus on good.  Plans to change his thoughts.  Stated he needs better medication.  No discharge plans. A:  Medications administered per MD orders.  Emotional support and encouragement given. R:  Denied SI and HI while talking to nurse, contracts for safety.  Denied A/V hallucinations.  Safety maintained with 15 minute checks.

## 2020-10-08 NOTE — BHH Counselor (Addendum)
Type of Therapy:  Group Therapy  Participation Level:  Active  Participation Quality:  Appropriate  Summary of Progress/Problems: Patient attended and participated in group. Patient was able to interact with peers appropriately.   Fredirick Lathe, LCSWA Clinicial Social Worker [employer]
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
## 2020-10-09 DIAGNOSIS — F332 Major depressive disorder, recurrent severe without psychotic features: Secondary | ICD-10-CM | POA: Diagnosis not present

## 2020-10-09 DIAGNOSIS — F1210 Cannabis abuse, uncomplicated: Secondary | ICD-10-CM | POA: Diagnosis not present

## 2020-10-09 MED ORDER — ONDANSETRON 4 MG PO TBDP
4.0000 mg | ORAL_TABLET | Freq: Three times a day (TID) | ORAL | Status: DC | PRN
  Administered 2020-10-09: 4 mg via ORAL
  Filled 2020-10-09: qty 1

## 2020-10-09 MED ORDER — WHITE PETROLATUM EX OINT
TOPICAL_OINTMENT | CUTANEOUS | Status: AC
  Filled 2020-10-09: qty 5

## 2020-10-09 NOTE — B[facility] Suicide Risk Assessment (Signed)
[facility] INPATIENT:  Family/Significant Other Suicide Prevention Education  Suicide Prevention Education:  Education Completed; Jamaal Bernasconi [phone] (Father) has been identified by the patient as the family member/significant other with whom the patient will be residing, and identified as the person(s) who will aid the patient in the event of a mental health crisis (suicidal ideations/suicide attempt).  With written consent from the patient, the family member/significant other has been provided the following suicide prevention education, prior to the and/or following the discharge of the patient.  The suicide prevention education provided includes the following:  Suicide risk factors  Suicide prevention and interventions  National Suicide Hotline telephone number  [facility] assessment telephone number  [facility] Emergency Assistance 911  [facility] and/or Residential Mobile Crisis Unit telephone number  Request made of family/significant other to:  Remove weapons (e.g., guns, rifles, knives), all items previously/currently identified as safety concern.    Remove drugs/medications (over-the-counter, prescriptions, illicit drugs), all items previously/currently identified as a safety concern.  The family member/significant other verbalizes understanding of the suicide prevention education information provided.  The family member/significant other agrees to remove the items of safety concern listed above.  CSW spoke with Mr. Merkey who states that his son has been overwhelmed lately because his girlfriend broke up with him, he has been arguing and fighting with his mother, his great-grandmother passed away, he lost his job and his car, and had to walk 6 miles from his mother's house to a friend's home before coming to the hospital.  Mr. Weatherall states that his son has never been like this before and has never had any other mental health issues.  Mr. Oquendo  states that upon discharge his son will come to live with him.  Mr. Benally also states that there are no weapons or firearms in the home.  CSW completed SPE with Mr. Lizer.   [employer] 10/09/2020, 3:22 PM

## 2020-10-09 NOTE — Progress Notes (Signed)
Nursing 1:1 note D:Pt observed sitting up in bed talking to sitter. RR even and unlabored. No distress noted. A: 1:1 observation continues for safety  R: Pt remains safe

## 2020-10-09 NOTE — Progress Notes (Signed)
Patient presents with an animated affect and anxious mood. He reported passive suicidal thoughts and verbally contracted for safety. During the assessment, he would laugh and look away when speaking. He reported that he was having suicidal thoughts last night and was looking for ways to hang himself, but didn't find anything to use. He assured Clinical research associate that he was not having active suicidal thoughts this morning and agreed to no self harm behaviors. He rated depression 10/10, and anxiety 10/10 (10 being worst). He denied AVH.   Orders reviewed. Vital signs reviewed. Verbal support provided. 15 minute checks performed for safety. Patient encouraged to attend groups.  Patient compliant with treatment plan.

## 2020-10-09 NOTE — Progress Notes (Signed)
Observation note: Patient observed sitting in the dayroom with sitter present. He is calm and cooperative. He denies SI. He reported that instead of acting out on the suicidal thoughts next time he will talk to someone. Patient remains on 1:1 observation for safety.

## 2020-10-09 NOTE — Progress Notes (Signed)
[facility] Group Notes:  (Nursing/MHT/Case Management/Adjunct)  Date:  10/09/2020  Time:  2015  Type of Therapy:  wrap up group  Participation Level:  Active  Participation Quality:  Appropriate, Attentive, Sharing and Supportive  Affect:  Appropriate  Cognitive:  Appropriate  Insight:  Improving  Engagement in Group:  Engaged  Modes of Intervention:  Clarification, Education and Support  Summary of Progress/Problems: Positive thinking and positive change were discussed.   Marcille Buffy 10/09/2020, 9:10 PM

## 2020-10-09 NOTE — Progress Notes (Signed)
1:1 observation note: Patient placed on one to one observation per Dr. Mason Jim. One to one education was provided to the pt and he was introduced to the sitter. Patient stated that he doesn't need to be on a one to one with a staff member. He was explained that one to one is for his safety. Patient verbalized understanding.

## 2020-10-09 NOTE — Progress Notes (Signed)
   10/08/20 2100  Psych Admission Type (Psych Patients Only)  Admission Status Voluntary  Psychosocial Assessment  Patient Complaints Anxiety;Depression  Eye Contact Brief  Facial Expression Sad;Anxious  Affect Anxious  Speech Logical/coherent  Interaction Assertive  Motor Activity Other (Comment) (wnl)  Appearance/Hygiene Unremarkable  Behavior Characteristics Cooperative;Anxious  Mood Depressed;Anxious  Thought Process  Coherency WDL  Content WDL  Delusions None reported or observed  Perception WDL  Hallucination None reported or observed  Judgment Poor  Confusion None  Danger to Self  Current suicidal ideation? Denies  Danger to Others  Danger to Others None reported or observed   Pt seen interacting with peers in dayroom. Pt denies SI, HI, AVH. Pt rates pain 6/10 as cramping. Pt went out to play basketball with others during rec time. Pt rates anxiety 8/10 and depression 5/10. Pt wants to feel better and get his anxiety and depression under control. Pt started on Zoloft tonight. All questions answered about his meds.

## 2020-10-09 NOTE — Progress Notes (Addendum)
Pt at nurse's station with sitter. C/o nausea and lightheadedness. Assessing VS. Contacted provider for order for Zofran. Will administer when medication available.

## 2020-10-09 NOTE — Progress Notes (Signed)
1:1 observation note: Patient observed sitting in the dayroom with sitter present. We spoke briefly in the hallway and he verbalized that he feels safe without a one to one sitter. He reported that he was having impulsive thoughts to hurt himself last night because he was upset after asking for towels and personal items that he never received.

## 2020-10-10 MED ORDER — OXCARBAZEPINE 150 MG PO TABS
150.0000 mg | ORAL_TABLET | Freq: Two times a day (BID) | ORAL | Status: DC
  Administered 2020-10-10 – 2020-10-11 (×3): 150 mg via ORAL
  Filled 2020-10-10 (×6): qty 1

## 2020-10-10 NOTE — Progress Notes (Signed)
   10/09/20 2000  Psych Admission Type (Psych Patients Only)  Admission Status Voluntary  Psychosocial Assessment  Patient Complaints Anxiety;Depression  Eye Contact Brief  Facial Expression Anxious  Affect Anxious  Speech Logical/coherent  Interaction Assertive  Motor Activity Other (Comment) (wnl)  Appearance/Hygiene Unremarkable  Behavior Characteristics Cooperative;Anxious  Mood Depressed;Anxious;Pleasant  Thought Process  Coherency WDL  Content WDL  Delusions None reported or observed  Perception WDL  Hallucination None reported or observed  Judgment Poor  Confusion None  Danger to Self  Current suicidal ideation? Denies  Danger to Others  Danger to Others None reported or observed   Pt on 1:1 observation for SI. Pt denies SI, HI, AVH and pain. Pt sitting in dayroom with sitter watching television. Pt acknowledges that his actions of this morning were due to impulsivity. He states he wants help for that. Pt says he was a little sleepy from the Zoloft earlier. Pt rates anxiety 5/10 and depression 4/10. Contracts for safety.

## 2020-10-10 NOTE — Progress Notes (Signed)
Nursing 1:1 note D:Pt observed sleeping in bed with eyes closed. RR even and unlabored. No distress noted. A: 1:1 observation continues for safety  R: Pt remains safe  

## 2020-10-10 NOTE — BHH Counselor (Signed)
LCSW Group Therapy Note  Type of Therapy/Topic: Group Therapy: Six Dimensions of Wellness  Participation Level: Active  Description of Group:  This group will address the concept of wellness and the six concepts of wellness: occupational, physical, social, intellectual, spiritual, and emotional. Patients will be encouraged to process areas in their lives that are out of balance and identify reasons for remaining unbalanced. Patients will be encouraged to explore ways to practice healthy habits daily to attain better physical and mental health outcomes.  Therapeutic Goals:  1. Identify aspects of wellness that they are doing well.  2. Identify aspects of wellness that they would like to improve upon.  3. Identify one action they can take to improve an aspect of wellness in their lives.  Summary of Patient Progress: Pt attended group and discussed personal wellness and coping skills with their peers while participating in a wellness activity outside. Pt interacted appropriately with peers during the activity. Pt shared that he is having a good day today and did not wake up with depressed thoughts pt said that this he feels is an improvement since his admission.   Therapeutic Modalities:  Cognitive Behavioral Therapy  Solution-Focused Therapy  Relapse Prevention  Fredirick Lathe, LCSWA Clinicial Social Worker [employer]

## 2020-10-10 NOTE — Plan of Care (Signed)
Spoke with nursing and he has been appropriate today with no safety concerns noted. He has contracted for safety and has done well today in terms of behaviors. Will discontinue line of sight observation at this time and resume q15 min checks.   [name], MD, Celene Skeen

## 2020-10-10 NOTE — Progress Notes (Signed)
   10/10/20 2056  Psych Admission Type (Psych Patients Only)  Admission Status Voluntary  Psychosocial Assessment  Patient Complaints None  Eye Contact Fair  Facial Expression Animated  Affect Flat  Speech Logical/coherent  Interaction Childlike;Intrusive  Motor Activity Other (Comment) (WNL)  Appearance/Hygiene Unremarkable  Behavior Characteristics Appropriate to situation  Mood Pleasant  Thought Process  Coherency WDL  Content WDL  Delusions WDL;None reported or observed  Perception WDL  Hallucination None reported or observed  Judgment Poor  Confusion None  Danger to Self  Current suicidal ideation? Denies  Danger to Others  Danger to Others None reported or observed

## 2020-10-10 NOTE — Progress Notes (Signed)
[facility] MD Progress Note  10/10/2020 7:47 AM Noah Stewart  MRN:  [id number]   Chief Complaint: suicidal ideation and depression  Subjective:  Noah Stewart is a 20 y.o. male with no reported past psychiatric history, who was initially admitted for inpatient psychiatric hospitalization on 10/07/2020 for management of worsening depression, anger issues, and SI. The patient is currently on Hospital Day 3.   Chart Review from last 24 hours:  The patient's chart was reviewed and nursing notes were reviewed. The patient's case was discussed in multidisciplinary team meeting. Per nursing, the patient did well on 1:1 yesterday without evidence of SI or behavioral issues. He did c/o nausea and lightheadedness last night and received PRN Zofran X1. He has been drinking Ensures. Per [facility] he was compliant with scheduled medications and required no PRNS for agitation.  Information Obtained Today During Patient Interview: The patient was seen and evaluated on the unit in the presence of social work. On assessment today the patient reports that he "feels great today." He states it is the first time in a long time that he woke up without depressive thoughts and he is feeling more hopeful today. He denies SI, intent or plan and states that his actions yesterday were impulsive and out of a sense of frustration. He requests to come off 1:1 and was advised that we will try him on close obs/line of sight during the day today and gradually transition back to q15 checks depending on his behaviors. He denies HI, AVH, paranoia, or irritability. He does feel he needs help with ease to anger and impulse control and we discussed medication options that may help with this. After discussion of options, he agrees to a trial of Trileptal and was advised he will need sodium monitoring while on this drug. He states he had some mild nausea with his Zoloft dose last night but would like to continue Zoloft at this time due to perceived  mood benefits. I discussed potentially changing his antidepressant if GI side-effects persist over coming days. He denies issues with anxiety or panic attacks. He states he talked to his dad earlier today and the conversation went well. He is planning to stay with his dad after discharge. He states his appetite is improving and he is still drinking Ensures. He reports fair sleep and denies current physical complaints.   Principal Problem: MDD (major depressive disorder), recurrent severe, without psychosis (HCC) Diagnosis: Principal Problem:   MDD (major depressive disorder), recurrent severe, without psychosis (HCC) Active Problems:   Suicidal ideations   Marijuana abuse  Total Time Spent in Direct Patient Care:  I personally spent 35 minutes on the unit in direct patient care. The direct patient care time included face-to-face time with the patient, reviewing the patient's chart, communicating with other professionals, and coordinating care. Greater than 50% of this time was spent in counseling or coordinating care with the patient regarding goals of hospitalization, psycho-education, and discharge planning needs.  Past Psychiatric History: see admission H&P  Past Medical History:  Past Medical History:  Diagnosis Date  . Murmur    Family History:  Family History  Problem Relation Age of Onset  . Hypertension Mother   . Heart disease Maternal Grandmother   . Sudden death Maternal Grandmother    Family Psychiatric  History: see admission H&P  Social History:   Unemployed and not in school; Living recently with friends; Daily THC use  Sleep: Fair  Appetite:  Improving  Current Medications: Current Facility-Administered  Medications  Medication Dose Route Frequency Provider Last Rate Last Admin  . acetaminophen (TYLENOL) tablet 650 mg  650 mg Oral Q6H PRN Laveda AbbeParks, Laurie Britton, NP      . feeding supplement (ENSURE ENLIVE / ENSURE PLUS) liquid 237 mL  237 mL Oral BID BM Laveda AbbeParks,  Laurie Britton, NP   237 mL at 10/09/20 1250  . hydrOXYzine (ATARAX/VISTARIL) tablet 25 mg  25 mg Oral TID PRN Laveda AbbeParks, Laurie Britton, NP   25 mg at 10/08/20 0755  . ondansetron (ZOFRAN-ODT) disintegrating tablet 4 mg  4 mg Oral Q8H PRN Nira ConnBerry, Jason A, NP   4 mg at 10/09/20 2243  . sertraline (ZOLOFT) tablet 25 mg  25 mg Oral QHS Laveda AbbeParks, Laurie Britton, NP   25 mg at 10/09/20 2138  . traZODone (DESYREL) tablet 50 mg  50 mg Oral QHS PRN,MR X 1 Laveda AbbeParks, Laurie Britton, NP        Lab Results:  Results for orders placed or performed during the hospital encounter of 10/07/20 (from the past 48 hour(s))  Lipid panel     Status: Abnormal   Collection Time: 10/08/20  6:07 PM  Result Value Ref Range   Cholesterol 173 0 - 200 mg/dL   Triglycerides 119124 <147<150 mg/dL   HDL 29 (L) >82>40 mg/dL   Total CHOL/HDL Ratio 6.0 RATIO   VLDL 25 0 - 40 mg/dL   LDL Cholesterol 956119 (H) 0 - 99 mg/dL    Comment:        Total Cholesterol/HDL:CHD Risk Coronary Heart Disease Risk Table                     Men   Women  1/2 Average Risk   3.4   3.3  Average Risk       5.0   4.4  2 X Average Risk   9.6   7.1  3 X Average Risk  23.4   11.0        Use the calculated Patient Ratio above and the CHD Risk Table to determine the patient's CHD Risk.        ATP III CLASSIFICATION (LDL):  <100     mg/dL   Optimal  [phone]  mg/dL   Near or Above                    Optimal  130-159  mg/dL   Borderline  [id number]  mg/dL   High  >629>190     mg/dL   Very High Performed at [facility], [address] [place], [place] [address]   TSH     Status: None   Collection Time: 10/08/20  6:07 PM  Result Value Ref Range   TSH 0.817 0.350 - 4.500 uIU/mL    Comment: Performed by a 3rd Generation assay with a functional sensitivity of <=0.01 uIU/mL. Performed at [facility], [address] [place], [place] [address]   Hemoglobin A1c     Status: None   Collection Time: 10/08/20  6:07 PM  Result Value Ref  Range   Hgb A1c MFr Bld 5.1 4.8 - 5.6 %    Comment: (NOTE) Pre diabetes:          5.7%-6.4%  Diabetes:              >6.4%  Glycemic control for   <7.0% adults with diabetes    Mean Plasma Glucose 99.67 mg/dL    Comment: Performed at [facility], [address] [place], [place] [address]    Blood Alcohol level:  Lab Results  Component Value Date   [facility] <10 10/06/2020   Psychiatric Specialty Exam: Physical Exam Vitals reviewed.  HENT:     Head: Normocephalic.  Pulmonary:     Effort: Pulmonary effort is normal.  Neurological:     Mental Status: He is alert.     Review of Systems  Respiratory: Negative for shortness of breath.   Cardiovascular: Negative for chest pain.  Gastrointestinal: Positive for nausea. Negative for abdominal pain, diarrhea and vomiting.  Neurological: Negative for headaches.    Blood pressure 116/84, pulse 61, temperature 98.2 F (36.8 C), temperature source Oral, resp. rate 16, height 6' (1.829 m), weight 75.8 kg, SpO2 100 %.Body mass index is 22.65 kg/m.  General Appearance: casually dressed, adequate hygiene, appears stated age  Eye Contact:  Good  Speech:  Clear and Coherent and Normal Rate  Volume:  Normal  Mood:  Described as "great" - appears more euthymic  Affect:  Less constricted, brighter affect, moderate, stable  Thought Process:  Goal Directed  Orientation:  Full (Time, Place, and Person)  Thought Content:  Denies AVH, paranoia, or delusions; no acute psychosis on exam  Suicidal Thoughts:  Denied  Homicidal Thoughts:  Denied  Memory:  Immediate;   Good  Judgement:  Improving  Insight:  Improving  Psychomotor Activity:  Normal  Concentration:  Concentration: Fair and Attention Span: Fair  Recall:  [employer] of Knowledge:  Fair  Language:  Good  Akathisia:  Negative  Assets:  Communication Skills Desire for Improvement Resilience Social Support  ADL's:  Intact  Cognition:  WNL  Sleep:  Number of Hours: 5.25    Physical Findings: AIMS: Facial and Oral Movements Muscles of Facial Expression: None, normal Lips and Perioral Area: None, normal Jaw: None, normal Tongue: None, normal,Extremity Movements Upper (arms, wrists, hands, fingers): None, normal Lower (legs, knees, ankles, toes): None, normal, Trunk Movements Neck, shoulders, hips: None, normal, Overall Severity Severity of abnormal movements (highest score from questions above): None, normal Incapacitation due to abnormal movements: None, normal Patient's awareness of abnormal movements (rate only patient's report): No Awareness, Dental Status Current problems with teeth and/or dentures?: No Does patient usually wear dentures?: No     Treatment Plan Summary: Diagnoses / Active Problems: MDD recurrent severe without psychotic features (r/o substance induced depressive d/o; r/o IED) Cannabis use d/o  PLAN: 1. Safety and Monitoring:  -- Voluntary admission to inpatient psychiatric unit for safety, stabilization and treatment  -- Daily contact with patient to assess and evaluate symptoms and progress in treatment  -- Patient's case to be discussed in multi-disciplinary team meeting  -- Observation Level : changed to close observation/line of sight  -- Vital signs:  q12 hours  -- Precautions: suicide  2. Psychiatric Diagnoses and Treatment:  MDD recurrent severe without psychotic features (r/o substance induced depressive d/o; r/o IED) -- Continue Zoloft 25mg  daily with monitoring for improvement in GI side-effects -- Start Trileptal 150mg  bid for help with irritability, impulse control, and ease to anger (r/b/se/a to med reviewed with patient and he consents to med trial) -- Patient placed on close observation/line of sight and off 1:1 today -- Would benefit from CBT/psychotherapy after discharge; may benefit from [facility] after discharge  -- Encouraged patient to participate in unit milieu and in scheduled group therapies   -- Short Term  Goals: Ability to identify changes in lifestyle to reduce  recurrence of condition will improve and Ability to demonstrate self-control will improve  -- Long Term Goals: Improvement in symptoms so as ready for discharge   Cannabis use d/o  -- Discussed the need for abstinence from illicit substances after discharge  -- Short Term Goals: Ability to identify triggers associated with substance abuse/mental health issues will improve  -- Long Term Goals: Improvement in symptoms so as ready for discharge   3. Medical Issues Being Addressed:   Additional admission labs reviewed:  -- Hemoglobin A1c 5.1  -- TSH 0.817  -- Lipid panel: cholesterol 173, triglycerides 124, HDL 29, LDL 119   4. Discharge Planning:   -- Social work and case management to assist with discharge planning and identification of hospital follow-up needs prior to discharge  -- Estimated LOS: 3-4 days  -- Discharge Concerns: Need to establish a safety plan; Medication compliance and effectiveness  -- Discharge Goals: Return home with outpatient referrals for mental health follow-up including medication management/psychotherapy  [name], MD, FAPA 10/10/2020, 7:47 AM

## 2020-10-10 NOTE — Plan of Care (Signed)
Pleasant and cooperative. Alert and oriented x 4. 1:1 level of observation discontinued. Patient is appropriate and contracts for safety. He is able to call staff and discuss his thoughts and concerns. No sign of distress noted. Safety monitored per routine protocol.

## 2020-10-10 NOTE — Progress Notes (Signed)
   10/10/20 2050  COVID-19 Daily Checkoff  Have you had a fever (temp > 37.80C/100F)  in the past 24 hours?  No  If you have had runny nose, nasal congestion, sneezing in the past 24 hours, has it worsened? No  COVID-19 EXPOSURE  Have you traveled outside the state in the past 14 days? No  Have you been in contact with someone with a confirmed diagnosis of COVID-19 or PUI in the past 14 days without wearing appropriate PPE? No  Have you been living in the same home as a person with confirmed diagnosis of COVID-19 or a PUI (household contact)? No  Have you been diagnosed with COVID-19? No

## 2020-10-11 NOTE — Progress Notes (Signed)
   10/11/20 2129  Psych Admission Type (Psych Patients Only)  Admission Status Voluntary  Psychosocial Assessment  Patient Complaints None  Eye Contact Brief  Facial Expression Animated  Affect Flat  Speech Logical/coherent  Interaction Minimal;No initiation  Motor Activity Other (Comment) (WNL)  Appearance/Hygiene Unremarkable  Behavior Characteristics Cooperative;Appropriate to situation  Mood Pleasant  Thought Process  Coherency WDL  Content WDL  Delusions None reported or observed  Perception WDL  Hallucination None reported or observed  Judgment Poor  Confusion None  Danger to Self  Current suicidal ideation? Denies  Danger to Others  Danger to Others None reported or observed

## 2020-10-11 NOTE — Progress Notes (Signed)
Patient had a good night, was compliant with hs medication and interacted well with peers.

## 2020-10-11 NOTE — [facility] Group Notes (Signed)
.  Psychoeducational Group Note    Date:10/11/2020 Time: 1300-1400    Life Skills:  A group where two lists are made. What people need and what are things that we do that are healthy. The lists are developed by the patients and it is explained that we often do the actions that are not healthy to get our list of needs met.   Purpose of Group: . The group focus' on teaching patients on how to identify their needs and how to develop the coping skills needed to get their needs met  Participation Level:  Active  Participation Quality:  Appropriate  Affect:  Appropriate  Cognitive:  Oriented  Insight:  Improving  Engagement in Group:  Engaged  Additional Comments:  Rates his energy  At a 10/10. Participated fully in the group.  Noah Stewart

## 2020-10-11 NOTE — Progress Notes (Signed)
D. Pt presents as friendly, childlike, calm and cooperative- somewhat superficial during interactions- per pt's self inventory, pt rated his depression, hopelessness and anxiety all 0's today. Reports that his goal today is "staying positive/happy". Pt currently denies SI/HI and AVH and agrees to contact staff before acting on any harmful thoughts.  A. Labs and vitals monitored. Pt given and educated on medications. Pt supported emotionally and encouraged to express concerns and ask questions.   R. Pt remains safe with 15 minute checks. Will continue POC.

## 2020-10-11 NOTE — Progress Notes (Signed)
   10/11/20 2126  COVID-19 Daily Checkoff  Have you had a fever (temp > 37.80C/100F)  in the past 24 hours?  No  If you have had runny nose, nasal congestion, sneezing in the past 24 hours, has it worsened? No  COVID-19 EXPOSURE  Have you traveled outside the state in the past 14 days? No  Have you been in contact with someone with a confirmed diagnosis of COVID-19 or PUI in the past 14 days without wearing appropriate PPE? No  Have you been living in the same home as a person with confirmed diagnosis of COVID-19 or a PUI (household contact)? No  Have you been diagnosed with COVID-19? No

## 2020-10-11 NOTE — [facility] Group Notes (Signed)
[facility] LCSW Group Therapy 10/11/2020 10:00am  Type of Therapy: Group Therapy: Boundaries  Description of Group: This group will address the use of boundaries in their personal lives. Patients will explore why boundaries are important, the difference between healthy and unhealthy boundaries, and negative and postive outcomes of different boundaries and will look at how boundaries can be crossed.  Patients will be encouraged to identify current boundaries in their own lives and identify what kind of boundary is being set. Facilitators will guide patients in utilizing problem-solving interventions to address and correct types boundaries being used and to address when no boundary is being used. Understanding and applying boundaries will be explored and addressed for obtaining and maintaining a balanced life. Patients will be encouraged to explore ways to assertively make their boundaries and needs known to significant others in their lives, using other group members and facilitator for role play, support, and feedback.   Therapeutic Goals: 1. Patient will identify areas in their life where setting clear boundaries could be used to improve their life.  2. Patient will identify signs/triggers that a boundary is not being respected. 3. Patient will identify two ways to set boundaries in order to achieve balance in their lives: 4. Patient will demonstrate ability to communicate their needs and set boundaries through discussion and/or role plays  Participation Level: Active  Participation Quality: Appropriate  Affect: Appropriate  Cognitive: Appropriate  Insight: Improving  Engagement in Therapy: Engaged  Modes of Intervention: Discussion and Education  Summary of Progress/Problems: Patient attended and participated in group. Patient shared that he struggles when people go against what he says. Patient plans to set boundaries within his relationships.    Ruthann Cancer MSW, LCSW Clincal  Social Worker  [facility]

## 2020-10-11 NOTE — Progress Notes (Signed)
[place] NOVEL CORONAVIRUS (COVID-19) DAILY CHECK-OFF SYMPTOMS - answer yes or no to each - every day NO YES  Have you had a fever in the past 24 hours?  . Fever (Temp > 37.80C / 100F) X   Have you had any of these symptoms in the past 24 hours? . New Cough .  Sore Throat  .  Shortness of Breath .  Difficulty Breathing .  Unexplained Body Aches   X   Have you had any one of these symptoms in the past 24 hours not related to allergies?   . Runny Nose .  Nasal Congestion .  Sneezing   X   If you have had runny nose, nasal congestion, sneezing in the past 24 hours, has it worsened?  X   EXPOSURES - check yes or no X   Have you traveled outside the state in the past 14 days?  X   Have you been in contact with someone with a confirmed diagnosis of COVID-19 or PUI in the past 14 days without wearing appropriate PPE?  X   Have you been living in the same home as a person with confirmed diagnosis of COVID-19 or a PUI (household contact)?    X   Have you been diagnosed with COVID-19?    X              What to do next: Answered NO to all: Answered YES to anything:   Proceed with unit schedule Follow the [facility] Inpatient Flowsheet.   

## 2020-10-11 NOTE — Progress Notes (Addendum)
[facility] MD Progress Note  10/11/2020 7:22 AM Noah Stewart  MRN:  [id number]   Chief Complaint: suicidal ideation and depression  Subjective:  Noah Stewart is a 20 y.o. male with no reported past psychiatric history, who was initially admitted for inpatient psychiatric hospitalization on 10/07/2020 for management of worsening depression, anger issues, and SI. The patient is currently on Hospital Day 4.   Chart Review from last 24 hours:  The patient's chart was reviewed and nursing notes were reviewed. The patient's case was discussed in multidisciplinary team meeting. Per nursing, the patient did well yesterday and overnight without safety concerns. He has been childlike and intrusive at times but no behavioral issues noted. He has attended group and been interacting well with peers. Per [facility] he has been compliant with scheduled medications and has been taking Ensure. He required no PRN medications yesterday.   Information Obtained Today During Patient Interview: The patient was seen and evaluated on the unit. He states that this is the best he has felt in terms of his mood in some time and he believes the medications are helping him. He denies further nausea with his Zoloft and states he tolerated the start of Trileptal without noted medication issues. He questions if his Trileptal needs to be increased further to help with residual anger issues and we discussed potentially increasing the dose tomorrow if he tolerates the medication well today. He denies SI, intent or plan, racing thoughts, impulsivity, or current irritability. He denies HI or recent anger outbursts and describes his mood as "more mellow." He denies cravings for THC. He reports improved sleep, fair appetite, and voices no physical complaints. He was encouraged to increase his po intake. He denies AVH, paranoia or delusions.  Principal Problem: MDD (major depressive disorder), recurrent severe, without psychosis (HCC) Diagnosis:  Principal Problem:   MDD (major depressive disorder), recurrent severe, without psychosis (HCC) Active Problems:   Suicidal ideations   Marijuana abuse  Total Time Spent in Direct Patient Care:  I personally spent 25 minutes on the unit in direct patient care. The direct patient care time included face-to-face time with the patient, reviewing the patient's chart, communicating with other professionals, and coordinating care. Greater than 50% of this time was spent in counseling or coordinating care with the patient regarding goals of hospitalization, psycho-education, and discharge planning needs.  Past Psychiatric History: see admission H&P  Past Medical History:  Past Medical History:  Diagnosis Date  . Murmur    Family History:  Family History  Problem Relation Age of Onset  . Hypertension Mother   . Heart disease Maternal Grandmother   . Sudden death Maternal Grandmother    Family Psychiatric  History: see admission H&P  Social History:   Unemployed and not in school; Living recently with friends; Daily THC use  Sleep: Fair  Appetite:  Improving  Current Medications: Current Facility-Administered Medications  Medication Dose Route Frequency Provider Last Rate Last Admin  . acetaminophen (TYLENOL) tablet 650 mg  650 mg Oral Q6H PRN [name], NP      . feeding supplement (ENSURE ENLIVE / ENSURE PLUS) liquid 237 mL  237 mL Oral BID BM [name], NP   237 mL at 10/10/20 1504  . hydrOXYzine (ATARAX/VISTARIL) tablet 25 mg  25 mg Oral TID PRN [name], NP   25 mg at 10/08/20 0755  . ondansetron (ZOFRAN-ODT) disintegrating tablet 4 mg  4 mg Oral Q8H PRN Jackelyn Poling, NP  4 mg at 10/09/20 2243  . OXcarbazepine (TRILEPTAL) tablet 150 mg  150 mg Oral BID [name] E, MD   150 mg at 10/10/20 1757  . sertraline (ZOLOFT) tablet 25 mg  25 mg Oral QHS [name], NP   25 mg at 10/10/20 2110  . traZODone (DESYREL) tablet 50 mg  50  mg Oral QHS PRN,MR X 1 [name], NP        Lab Results:  No results found for this or any previous visit (from the past 48 hour(s)).  Blood Alcohol level:  Lab Results  Component Value Date   [facility] <10 10/06/2020   Psychiatric Specialty Exam: Physical Exam Vitals reviewed.  HENT:     Head: Normocephalic.  Pulmonary:     Effort: Pulmonary effort is normal.  Neurological:     Mental Status: He is alert.     Review of Systems  Respiratory: Negative for shortness of breath.   Cardiovascular: Negative for chest pain.  Gastrointestinal: Negative for abdominal pain, diarrhea, nausea and vomiting.  Neurological: Negative for headaches.    Blood pressure 120/73, pulse 74, temperature 98.2 F (36.8 C), temperature source Oral, resp. rate 16, height 6' (1.829 m), weight 75.8 kg, SpO2 100 %.Body mass index is 22.65 kg/m.  General Appearance: casually dressed, good hygiene, appears stated age  Eye Contact:  Good  Speech:  Clear and Coherent and Normal Rate  Volume:  Normal  Mood:  Described as "mellow" - appears more euthymic  Affect:  Less constricted, brighter affect, moderate, stable  Thought Process:  Goal Directed, linear  Orientation:  Full (Time, Place, and Person)  Thought Content:  Denies AVH, paranoia, or delusions; no acute psychosis on exam  Suicidal Thoughts:  Denied  Homicidal Thoughts:  Denied  Memory:  Immediate;   Good  Judgement:  Improving  Insight:  Improving  Psychomotor Activity:  Normal  Concentration:  Concentration: Fair and Attention Span: Fair  Recall:  [employer] of Knowledge:  Fair  Language:  Good  Akathisia:  Negative  Assets:  Communication Skills Desire for Improvement Resilience Social Support  ADL's:  Intact  Cognition:  WNL  Sleep:  Number of Hours: 6   Physical Findings: AIMS: Facial and Oral Movements Muscles of Facial Expression: None, normal Lips and Perioral Area: None, normal Jaw: None, normal Tongue: None,  normal,Extremity Movements Upper (arms, wrists, hands, fingers): None, normal Lower (legs, knees, ankles, toes): None, normal, Trunk Movements Neck, shoulders, hips: None, normal, Overall Severity Severity of abnormal movements (highest score from questions above): None, normal Incapacitation due to abnormal movements: None, normal Patient's awareness of abnormal movements (rate only patient's report): No Awareness, Dental Status Current problems with teeth and/or dentures?: No Does patient usually wear dentures?: No     Treatment Plan Summary: Diagnoses / Active Problems: MDD recurrent severe without psychotic features (r/o substance induced depressive d/o; r/o IED) Cannabis use d/o  PLAN: 1. Safety and Monitoring:  -- Voluntary admission to inpatient psychiatric unit for safety, stabilization and treatment  -- Daily contact with patient to assess and evaluate symptoms and progress in treatment  -- Patient's case to be discussed in multi-disciplinary team meeting  -- Observation Level : q15 min checks  -- Vital signs:  q12 hours  -- Precautions: suicide  2. Psychiatric Diagnoses and Treatment:  MDD recurrent severe without psychotic features (r/o substance induced depressive d/o; r/o IED) -- Continue Zoloft 25mg  daily  -- Continue Trileptal 150mg  bid today and titrate  up to 300mg  bid tomorrow for help with irritability, impulse control, and ease to anger- titrating up as tolerated - checking BMP on Tuesday for monitoring of Na+ on med -- Would benefit from CBT/psychotherapy after discharge; may benefit from [facility] after discharge  -- Encouraged patient to participate in unit milieu and in scheduled group therapies   -- Short Term Goals: Ability to identify changes in lifestyle to reduce recurrence of condition will improve and Ability to demonstrate self-control will improve  -- Long Term Goals: Improvement in symptoms so as ready for discharge   Cannabis use d/o  -- Discussed the  need for abstinence from illicit substances after discharge  -- Short Term Goals: Ability to identify triggers associated with substance abuse/mental health issues will improve  -- Long Term Goals: Improvement in symptoms so as ready for discharge   3. Medical Issues Being Addressed:   Additional admission labs reviewed:  -- Hemoglobin A1c 5.1  -- TSH 0.817  -- Lipid panel: cholesterol 173, triglycerides 124, HDL 29, LDL 119   4. Discharge Planning:   -- Social work and case management to assist with discharge planning and identification of hospital follow-up needs prior to discharge  -- Estimated LOS: 3-4 days  -- Discharge Concerns: Need to establish a safety plan; Medication compliance and effectiveness  -- Discharge Goals: Return home with outpatient referrals for mental health follow-up including medication management/psychotherapy  [facility], MD, FAPA 10/11/2020, 7:22 AM

## 2020-10-11 NOTE — BHH Group Notes (Signed)
.  Psychoeducational Group Note  Date: 10-11-2020 Time: 0900-1000    Goal Setting   Purpose of Group: This group helps to provide patients with the steps of setting a goal that is specific, measurable, attainable, realistic and time specific. A discussion on how we keep ourselves stuck with negative self talk.    Participation Level:  Active  Participation Quality:  Appropriate  Affect:  Appropriate  Cognitive:  Appropriate  Insight:  Improving  Engagement in Group:  Engaged  Additional Comments: Rates his energy  At a 10/10. Parcipated fully in the group.  Dione Housekeeper

## 2020-10-12 MED ORDER — OXCARBAZEPINE 300 MG PO TABS
300.0000 mg | ORAL_TABLET | Freq: Two times a day (BID) | ORAL | Status: DC
  Administered 2020-10-12 – 2020-10-14 (×5): 300 mg via ORAL
  Filled 2020-10-12: qty 1
  Filled 2020-10-12: qty 14
  Filled 2020-10-12 (×3): qty 1
  Filled 2020-10-12: qty 14
  Filled 2020-10-12 (×3): qty 1

## 2020-10-12 MED ORDER — WHITE PETROLATUM EX OINT
TOPICAL_OINTMENT | CUTANEOUS | Status: AC
  Administered 2020-10-12: 1
  Filled 2020-10-12: qty 5

## 2020-10-12 MED ORDER — SERTRALINE HCL 50 MG PO TABS
50.0000 mg | ORAL_TABLET | Freq: Every day | ORAL | Status: DC
Start: 2020-10-12 — End: 2020-10-14
  Administered 2020-10-12 – 2020-10-13 (×2): 50 mg via ORAL
  Filled 2020-10-12: qty 1
  Filled 2020-10-12: qty 7
  Filled 2020-10-12 (×2): qty 1

## 2020-10-12 NOTE — Progress Notes (Signed)
D. Pt a little less animated today, but is pleasant- smiles during interactions. Pt observed attending group this am, and has been visible interacting appropriately with peers and staff. Per pt's self inventory, pt rated his depression, hopelessness and anxiety all 0's today. Pt states that his goal today is to "get some rest".  Pt currently denies SI/HI and AVH  A. Labs and vitals monitored. Pt given and educated on medications. Pt supported emotionally and encouraged to express concerns and ask questions.   R. Pt remains safe with 15 minute checks. Will continue POC.

## 2020-10-12 NOTE — B[facility] Group Notes (Signed)
Adult Psychoeducational Group Not Date:  10/12/2020 Time:  0900-1045 Group Topic/Focus: PROGRESSIVE RELAXATION. A group where deep breathing is taught and tensing and relaxation muscle groups is used. Imagery is used as well.  Pts are asked to imagine 3 pillars that hold them up when they are not able to hold themselves up.  Participation Level:  Did not attend  Tyran Huser A    

## 2020-10-12 NOTE — Progress Notes (Signed)
[facility] MD Progress Note  10/12/2020 7:11 AM Noah Stewart  MRN:  [id number]   Chief Complaint: suicidal ideation and depression  Subjective:  Noah Stewart is a 20 y.o. male with no reported past psychiatric history, who was initially admitted for inpatient psychiatric hospitalization on 10/07/2020 for management of worsening depression, anger issues, and SI. The patient is currently on Hospital Day 5.   Chart Review from last 24 hours:  The patient's chart was reviewed and nursing notes were reviewed. The patient's case was discussed in multidisciplinary team meeting. Per nursing,the patient attended and participated in all groups and had no behavioral or safety concerns on the unit. Per MAR, he was compliant with scheduled medications and with Ensure. He required no PRN medications.   Information Obtained Today During Patient Interview: The patient was seen and evaluated on the unit. He states he is feeling "relaxed" today and a little less sad compared to admission. He has felt a little tired with his nighttime Zoloft but deneis further nausea of GI sx with the medication. We discussed potentially titrating up on Zoloft to get him to a more therapeutic dose for depression and he is in agreement with this now that he has adjusted to the 25mg . He took an increase in Trileptal this morning and continues to feel that the Trileptal is helping reduce his impulsivity, anger, and irritability. I advised that the dose increase in Trileptal could also cause some sedation, and he is aware we will check a BMP for Na+ monitoring on Tuesday. He deneis SI, HI, AVH, paranoia, racing thoughts, grandiosity, or frustration intolerance at this time. He still is not eating much but is planning to eat lunch and is taking Ensure. He states his sleep is good and he voices no physical complaints.   Principal Problem: MDD (major depressive disorder), recurrent severe, without psychosis (HCC) Diagnosis: Principal  Problem:   MDD (major depressive disorder), recurrent severe, without psychosis (HCC) Active Problems:   Suicidal ideations   Marijuana abuse  Total Time Spent in Direct Patient Care:  I personally spent 30 minutes on the unit in direct patient care. The direct patient care time included face-to-face time with the patient, reviewing the patient's chart, communicating with other professionals, and coordinating care. Greater than 50% of this time was spent in counseling or coordinating care with the patient regarding goals of hospitalization, psycho-education, and discharge planning needs.  Past Psychiatric History: see admission H&P  Past Medical History:  Past Medical History:  Diagnosis Date  . Murmur    Family History:  Family History  Problem Relation Age of Onset  . Hypertension Mother   . Heart disease Maternal Grandmother   . Sudden death Maternal Grandmother    Family Psychiatric  History: see admission H&P  Social History:   Unemployed and not in school; Living recently with friends; Daily THC use  Sleep: Good  Appetite:  Improving  Current Medications: Current Facility-Administered Medications  Medication Dose Route Frequency Provider Last Rate Last Admin  . acetaminophen (TYLENOL) tablet 650 mg  650 mg Oral Q6H PRN Thursday, NP      . feeding supplement (ENSURE ENLIVE / ENSURE PLUS) liquid 237 mL  237 mL Oral BID BM [name], NP   237 mL at 10/11/20 1309  . hydrOXYzine (ATARAX/VISTARIL) tablet 25 mg  25 mg Oral TID PRN 10/13/20, NP   25 mg at 10/08/20 0755  . ondansetron (ZOFRAN-ODT) disintegrating tablet 4 mg  4  mg Oral Q8H PRN [name] A, NP   4 mg at 10/09/20 2243  . Oxcarbazepine (TRILEPTAL) tablet 300 mg  300 mg Oral BID Mason Jim, Kuulei Kleier E, MD      . sertraline (ZOLOFT) tablet 25 mg  25 mg Oral QHS [name], NP   25 mg at 10/11/20 2157  . traZODone (DESYREL) tablet 50 mg  50 mg Oral QHS PRN,MR X 1 [name], NP        Lab Results:  No results found for this or any previous visit (from the past 48 hour(s)).  Blood Alcohol level:  Lab Results  Component Value Date   [facility] <10 10/06/2020   Psychiatric Specialty Exam: Physical Exam Vitals reviewed.  HENT:     Head: Normocephalic.  Pulmonary:     Effort: Pulmonary effort is normal.  Neurological:     Mental Status: He is alert.     Review of Systems  Respiratory: Negative for shortness of breath.   Cardiovascular: Negative for chest pain.  Gastrointestinal: Negative for abdominal pain, constipation, diarrhea, nausea and vomiting.  Neurological: Negative for headaches.    Blood pressure 135/71, pulse 84, temperature 98.1 F (36.7 C), temperature source Oral, resp. rate 16, height 6' (1.829 m), weight 75.8 kg, SpO2 100 %.Body mass index is 22.65 kg/m.  General Appearance: casually dressed, good hygiene, appears stated age  Eye Contact:  Good  Speech:  Clear and Coherent and Normal Rate  Volume:  Normal  Mood:  Described as "relaxed" - appears more euthymic  Affect:  Less constricted, brighter affect, moderate, stable  Thought Process:  Goal Directed, linear  Orientation:  Full (Time, Place, and Person)  Thought Content:  Denies AVH, paranoia, or delusions; no acute psychosis on exam  Suicidal Thoughts:  Denied  Homicidal Thoughts:  Denied  Memory:  Immediate;   Good  Judgement:  Improving  Insight:  Improving  Psychomotor Activity:  Normal  Concentration:  Concentration: Fair and Attention Span: Fair  Recall:  [employer] of Knowledge:  Fair  Language:  Good  Akathisia:  Negative  Assets:  Communication Skills Desire for Improvement Resilience Social Support  ADL's:  Intact  Cognition:  WNL  Sleep:  Number of Hours: 6.75   Physical Findings: AIMS: Facial and Oral Movements Muscles of Facial Expression: None, normal Lips and Perioral Area: None, normal Jaw: None, normal Tongue: None, normal,Extremity  Movements Upper (arms, wrists, hands, fingers): None, normal Lower (legs, knees, ankles, toes): None, normal, Trunk Movements Neck, shoulders, hips: None, normal, Overall Severity Severity of abnormal movements (highest score from questions above): None, normal Incapacitation due to abnormal movements: None, normal Patient's awareness of abnormal movements (rate only patient's report): No Awareness, Dental Status Current problems with teeth and/or dentures?: No Does patient usually wear dentures?: No     Treatment Plan Summary: Diagnoses / Active Problems: MDD recurrent severe without psychotic features (r/o substance induced depressive d/o; r/o IED) Cannabis use d/o  PLAN: 1. Safety and Monitoring:  -- Voluntary admission to inpatient psychiatric unit for safety, stabilization and treatment  -- Daily contact with patient to assess and evaluate symptoms and progress in treatment  -- Patient's case to be discussed in multi-disciplinary team meeting  -- Observation Level : q15 min checks  -- Vital signs:  q12 hours  -- Precautions: suicide  2. Psychiatric Diagnoses and Treatment:  MDD recurrent severe without psychotic features (r/o substance induced depressive d/o; r/o IED) -- Increase Zoloft to 50mg  daily  in an effort to get him to a more therapeutic dose given the severity of his depression on admission  -- Increase Trileptal to 300mg  bid for help with irritability, impulse control, and ease to anger -checking BMP on Tuesday for monitoring of Na+ on med -- Would benefit from CBT/psychotherapy after discharge; may benefit from [facility] after discharge  -- Encouraged patient to participate in unit milieu and in scheduled group therapies   -- Short Term Goals: Ability to identify changes in lifestyle to reduce recurrence of condition will improve and Ability to demonstrate self-control will improve  -- Long Term Goals: Improvement in symptoms so as ready for discharge   Cannabis use  d/o  -- Discussed the need for abstinence from illicit substances after discharge  -- Short Term Goals: Ability to identify triggers associated with substance abuse/mental health issues will improve  -- Long Term Goals: Improvement in symptoms so as ready for discharge  3. Discharge Planning:   -- Social work and case management to assist with discharge planning and identification of hospital follow-up needs prior to discharge  -- Estimated LOS: 2 days  -- Discharge Concerns: Need to establish a safety plan; Medication compliance and effectiveness  -- Discharge Goals: Return home with outpatient referrals for mental health follow-up including medication management/psychotherapy  [facility], MD, FAPA 10/12/2020, 7:11 AM

## 2020-10-12 NOTE — Progress Notes (Signed)

## 2020-10-13 MED ORDER — WHITE PETROLATUM EX OINT
TOPICAL_OINTMENT | CUTANEOUS | Status: AC
  Filled 2020-10-13: qty 5

## 2020-10-13 NOTE — [facility] Group Notes (Signed)
The focus of this group is to help patients establish daily goals to achieve during treatment and discuss how the patient can incorporate goal setting into their daily lives to aide in recovery.  Pt did not attend group 

## 2020-10-13 NOTE — Progress Notes (Signed)
Pt denied SI/HI/AVH.  Pt participating in groups and interacting with peers.  Pt had questions about sexual side effects of Zoloft and was concerned because "I'm not gonna take this stuff if it causes sexual side effects."  Pt was pleasant throughout the day.  Pt remains safe with q 15 min checks in place.

## 2020-10-13 NOTE — Progress Notes (Signed)
Recreation Therapy Notes  Date: 3.21.22 Time: 0930 Location: [address] Dayroom  Group Topic: Stress Management   Goal Area(s) Addresses:  Patient will actively participate in stress management techniques presented during session.   Intervention: Stress management techniques  Activity :Guided Imagery.  LRT read a script that focused on taking a journey through a wildlife sanctuary.  Patients were to listen and follow along as meditation played to engage in activity.  Education:  Stress Management, Discharge Planning.   Education Outcome: Acknowledges education  Clinical Observations/Feedback: Patient did not attend group activity.    Marjette Lindsay, LRT/CTRS         Lindsay, Marjette A 10/13/2020 11:26 AM 

## 2020-10-13 NOTE — BHH Counselor (Signed)
CSW provided patient with a list of resources that include: agencies that provide therapy, housing resources, free food resources, and substance use resources.   

## 2020-10-13 NOTE — B[facility] Group Notes (Signed)
Occupational Therapy Group Note Date: 10/13/2020 Group Topic/Focus: Sensory Modulation  Group Description: Group encouraged increased engagement and participation through discussion focused on sensory modulation and self-soothing through use of the 8 senses. Discussion introduced the concept of sensory modulation and integration, focusing on how we can utilize our body and it's senses to self-soothe or cope, when we are experiencing an over or under-whelming sensation or feeling. Group members were introduced to a sensory diet checklist as a helpful tool/resource that can be utilized to identify what activities and strategies we prefer and do not prefer based upon our response to different stimulus. The concept of alerting vs calming activities was also introduced to understand how to counteract how we are feeling (Example: when we are feeling overwhelmed/stressed, engage in something calming. When we are feeling depressed/low energy, engage in something alerting). Group members engaged actively in discussion sharing their own personal sensory likes/dislikes.   Therapeutic Goals: Identify calming vs alerting activities to self soothe Identify activities to cope and self soothe through use of the 8 senses Participation Level: Active   Participation Quality: Independent   Behavior: Calm, Cooperative and Guarded   Speech/Thought Process: Focused   Affect/Mood: Constricted   Insight: Fair   Judgement: Fair   Individualization: Thurmon was moderately engaged in their participation of group discussion/activity. He identified listening to music and making music as self-soothing strategies that calm him down. He also shared that he likes to "chew on my straw' and he never recognized this was something that was soothing to him until now.   Modes of Intervention: Activity, Discussion and Education  Patient Response to Interventions:  Attentive, Engaged and Receptive   Plan: Continue to engage patient  in OT groups 2 - 3x/week.  10/13/2020  Donne Hazel, MOT, OTR/L

## 2020-10-13 NOTE — B[facility] Group Notes (Signed)
Type of Therapy and Topic:  Group Therapy:  Positive Affirmations   Participation Level:  Minimal  Description of Group: This group addressed positive affirmation toward self and others. Patients went around the room and identified two positive things about themselves and two positive things about a peer in the room. Patients reflected on how it felt to share something positive with others, to identify positive things about themselves, and to hear positive things from others. Patients were encouraged to have a daily reflection of positive characteristics or circumstances. Therapeutic Goals 1. Patient will verbalize two of their positive qualities 2. Patient will demonstrate empathy for others by stating two positive qualities about a peer in the group 3. Patient will verbalize their feelings when voicing positive self affirmations and when voicing positive affirmations of others 4. Patients will discuss the potential positive impact on their wellness/recovery of focusing on positive traits of self and others. Summary of Patient Progress:  Noah Stewart attended the group and states that he is happy that he is suppose to leave the hospital tomorrow.  Noah Stewart did not share anything else.  Noah Stewart accepted the handouts that were provided.   Therapeutic Modalities Cognitive Behavioral Therapy Motivational Interviewing

## 2020-10-13 NOTE — Progress Notes (Signed)
Spiritual care group on grief and loss facilitated by chaplain Graciemae Delisle MDiv, BCC  Group Goal:  Support / Education around grief and loss Members engage in facilitated group support and psycho-social education.  Group Description:  Following introductions and group rules, group members engaged in facilitated group dialog and support around topic of loss, with particular support around experiences of loss in their lives. Group Identified types of loss (relationships / self / things) and identified patterns, circumstances, and changes that precipitate losses. Reflected on thoughts / feelings around loss, normalized grief responses, and recognized variety in grief experience.   Group noted Worden's four tasks of grief in discussion.  Group drew on Adlerian / Rogerian, narrative, MI, Patient Progress:  Did not attend group 

## 2020-10-13 NOTE — Progress Notes (Signed)
[facility] MD Progress Note  10/13/2020 7:02 AM Noah Stewart  MRN:  [id number]   Chief Complaint: suicidal ideation and depression  Subjective:  Noah Stewart is a 20 y.o. male with no reported past psychiatric history, who was initially admitted for inpatient psychiatric hospitalization on 10/07/2020 for management of worsening depression, anger issues, and SI. The patient is currently on Hospital Day 6.   Chart Review from last 24 hours:  The patient's chart was reviewed and nursing notes were reviewed. The patient's case was discussed in multidisciplinary team meeting. Per nursing,the patient was visible in the milieu and was interacting well with staff and peers. No behavioral or safety issues noted. Per [facility] he was compliant with scheduled medications and received no PRN medications.   Information Obtained Today During Patient Interview: The patient was seen and evaluated on the unit. He is more irritable today as many of his peers are discharging and he questions why he is still in the hospital. It was explained that he needs to be observed for possible return of SI with dose increase in his Zoloft after his suicide gesture on the inpatient unit last week. I advised that the team still has to complete final safety planning with his father to clarify if he is returning to baseline and to verify outpatient appointments before he can go. I also discussed the need for a BMP tomorrow to check his Na+ after recent increase in his Trileptal. He admits he feels irritable today and was advised that how he manages his emotions today will help tell the team if he is ready for discharge potentially tomorrow. He denies SI or HI and denies AVH, delusions, or paranoia. He denies nausea or oversedation with medication changes made yesterday. He states his sleep is good and his appetite is fair. He ate some lunch yesterday but states he "slept through breakfast" and took Ensure. He voices no physical complaints.  Supportive therapy provided.   Principal Problem: MDD (major depressive disorder), recurrent severe, without psychosis (HCC) Diagnosis: Principal Problem:   MDD (major depressive disorder), recurrent severe, without psychosis (HCC) Active Problems:   Suicidal ideations   Marijuana abuse  Total Time Spent in Direct Patient Care:  I personally spent 25 minutes on the unit in direct patient care. The direct patient care time included face-to-face time with the patient, reviewing the patient's chart, communicating with other professionals, and coordinating care. Greater than 50% of this time was spent in counseling or coordinating care with the patient regarding goals of hospitalization, psycho-education, and discharge planning needs.  Past Psychiatric History: see admission H&P  Past Medical History:  Past Medical History:  Diagnosis Date  . Murmur    Family History:  Family History  Problem Relation Age of Onset  . Hypertension Mother   . Heart disease Maternal Grandmother   . Sudden death Maternal Grandmother    Family Psychiatric  History: see admission H&P  Social History:   Unemployed and not in school; Living recently with friends; Daily THC use  Sleep: Good  Appetite: Fair  Current Medications: Current Facility-Administered Medications  Medication Dose Route Frequency Provider Last Rate Last Admin  . acetaminophen (TYLENOL) tablet 650 mg  650 mg Oral Q6H PRN [name], NP      . feeding supplement (ENSURE ENLIVE / ENSURE PLUS) liquid 237 mL  237 mL Oral BID BM [name], NP   237 mL at 10/12/20 1623  . hydrOXYzine (ATARAX/VISTARIL) tablet 25 mg  25  mg Oral TID PRN [name], NP   25 mg at 10/08/20 0755  . ondansetron (ZOFRAN-ODT) disintegrating tablet 4 mg  4 mg Oral Q8H PRN [name] A, NP   4 mg at 10/09/20 2243  . Oxcarbazepine (TRILEPTAL) tablet 300 mg  300 mg Oral BID [name] E, MD   300 mg at 10/12/20 1730  .  sertraline (ZOLOFT) tablet 50 mg  50 mg Oral QHS [name], MD   50 mg at 10/12/20 2108  . traZODone (DESYREL) tablet 50 mg  50 mg Oral QHS PRN,MR X 1 [name], NP        Lab Results:  No results found for this or any previous visit (from the past 48 hour(s)).  Blood Alcohol level:  Lab Results  Component Value Date   [facility] <10 10/06/2020   Psychiatric Specialty Exam: Physical Exam Vitals reviewed.  HENT:     Head: Normocephalic.  Pulmonary:     Effort: Pulmonary effort is normal.  Neurological:     Mental Status: He is alert.     Review of Systems  Respiratory: Negative for shortness of breath.   Cardiovascular: Negative for chest pain.  Gastrointestinal: Negative for abdominal pain, constipation, diarrhea, nausea and vomiting.  Neurological: Negative for headaches.    Blood pressure 122/67, pulse 71, temperature 98.2 F (36.8 C), temperature source Oral, resp. rate 16, height 6' (1.829 m), weight 75.8 kg, SpO2 100 %.Body mass index is 22.65 kg/m.  General Appearance: casually dressed, good hygiene, appears stated age  Eye Contact:  Good  Speech:  Clear and Coherent and Normal Rate  Volume:  Normal  Mood:  irritable  Affect:  Irritable, constricted  Thought Process:  Goal Directed, ruminative about discharge  Orientation:  Full (Time, Place, and Person)  Thought Content:  Denies AVH, paranoia, or delusions; no acute psychosis on exam  Suicidal Thoughts:  Denied  Homicidal Thoughts:  Denied  Memory:  Immediate;   Good  Judgement:  Fair  Insight:  Improving  Psychomotor Activity:  Normal  Concentration:  Concentration: Fair and Attention Span: Fair  Recall:  [employer] of Knowledge:  Fair  Language:  Good  Akathisia:  Negative  Assets:  Communication Skills Desire for Improvement Resilience Social Support  ADL's:  Intact  Cognition:  WNL  Sleep:  Number of Hours: 6.75   Physical Findings: AIMS: Facial and Oral Movements Muscles of Facial  Expression: None, normal Lips and Perioral Area: None, normal Jaw: None, normal Tongue: None, normal,Extremity Movements Upper (arms, wrists, hands, fingers): None, normal Lower (legs, knees, ankles, toes): None, normal, Trunk Movements Neck, shoulders, hips: None, normal, Overall Severity Severity of abnormal movements (highest score from questions above): None, normal Incapacitation due to abnormal movements: None, normal Patient's awareness of abnormal movements (rate only patient's report): No Awareness, Dental Status Current problems with teeth and/or dentures?: No Does patient usually wear dentures?: No     Treatment Plan Summary: Diagnoses / Active Problems: MDD recurrent severe without psychotic features (r/o substance induced depressive d/o; r/o IED) Cannabis use d/o  PLAN: 1. Safety and Monitoring:  -- Voluntary admission to inpatient psychiatric unit for safety, stabilization and treatment  -- Daily contact with patient to assess and evaluate symptoms and progress in treatment  -- Patient's case to be discussed in multi-disciplinary team meeting  -- Observation Level : q15 min checks  -- Vital signs:  q12 hours  -- Precautions: suicide  2. Psychiatric Diagnoses and Treatment:  MDD recurrent severe without psychotic features (r/o substance induced depressive d/o; r/o IED) -- Continue Zoloft 50mg  daily with monitoring for side-effects, SI, or mood escalation with dose increase -- Continue Trileptal 300mg  bid for help with irritability, impulse control, and ease to anger -checking BMP tomorrow for monitoring of Na+ on med -- Would benefit from CBT/psychotherapy after discharge; may benefit from [facility] after discharge  -- Encouraged patient to participate in unit milieu and in scheduled group therapies   -- Short Term Goals: Ability to identify changes in lifestyle to reduce recurrence of condition will improve and Ability to demonstrate self-control will improve  -- Long Term  Goals: Improvement in symptoms so as ready for discharge   Cannabis use d/o  -- Discussed the need for abstinence from illicit substances after discharge  -- Short Term Goals: Ability to identify triggers associated with substance abuse/mental health issues will improve  -- Long Term Goals: Improvement in symptoms so as ready for discharge  3. Discharge Planning:   -- Social work and case management to assist with discharge planning and identification of hospital follow-up needs prior to discharge  -- Estimated LOS: 1-2 days  -- Discharge Concerns: Need to establish a safety plan; Medication compliance and effectiveness  -- Discharge Goals: Return home with outpatient referrals for mental health follow-up including medication management/psychotherapy  , MD, FAPA 10/13/2020, 7:02 AM

## 2020-10-13 NOTE — Tx Team (Signed)
Interdisciplinary Treatment and Diagnostic Plan Update  10/13/2020 Time of Session: 9:10am  Noah Stewart MRN: [id number]  Principal Diagnosis: MDD (major depressive disorder), recurrent severe, without psychosis (HCC)  Secondary Diagnoses: Principal Problem:   MDD (major depressive disorder), recurrent severe, without psychosis (HCC) Active Problems:   Suicidal ideations   Marijuana abuse   Current Medications:  Current Facility-Administered Medications  Medication Dose Route Frequency Provider Last Rate Last Admin  . acetaminophen (TYLENOL) tablet 650 mg  650 mg Oral Q6H PRN [name], NP      . feeding supplement (ENSURE ENLIVE / ENSURE PLUS) liquid 237 mL  237 mL Oral BID BM [name], NP   237 mL at 10/13/20 0945  . hydrOXYzine (ATARAX/VISTARIL) tablet 25 mg  25 mg Oral TID PRN [name], NP   25 mg at 10/08/20 0755  . ondansetron (ZOFRAN-ODT) disintegrating tablet 4 mg  4 mg Oral Q8H PRN [name] A, NP   4 mg at 10/09/20 2243  . Oxcarbazepine (TRILEPTAL) tablet 300 mg  300 mg Oral BID [name] E, MD   300 mg at 10/13/20 0945  . sertraline (ZOLOFT) tablet 50 mg  50 mg Oral QHS [name], MD   50 mg at 10/12/20 2108  . traZODone (DESYREL) tablet 50 mg  50 mg Oral QHS PRN,MR X 1 [name], NP       PTA Medications: Medications Prior to Admission  Medication Sig Dispense Refill Last Dose  . albuterol (PROVENTIL HFA;VENTOLIN HFA) 108 (90 BASE) MCG/ACT inhaler Inhale 1-2 puffs into the lungs as needed for wheezing or shortness of breath.     Marland Kitchen ibuprofen (ADVIL) 800 MG tablet Take 1 tablet (800 mg total) by mouth 3 (three) times daily as needed for moderate pain. (Patient not taking: No sig reported) 21 tablet 0     Patient Stressors: Loss of Grandmother 1 month ago Marital or family conflict  Patient Strengths: Wellsite geologist fund of knowledge Physical Health  Treatment Modalities: Medication  Management, Group therapy, Case management,  1 to 1 session with clinician, Psychoeducation, Recreational therapy.   Physician Treatment Plan for Primary Diagnosis: MDD (major depressive disorder), recurrent severe, without psychosis (HCC) Long Term Goal(s): Improvement in symptoms so as ready for discharge Improvement in symptoms so as ready for discharge   Short Term Goals: Ability to identify changes in lifestyle to reduce recurrence of condition will improve Ability to demonstrate self-control will improve Ability to identify triggers associated with substance abuse/mental health issues will improve  Medication Management: Evaluate patient's response, side effects, and tolerance of medication regimen.  Therapeutic Interventions: 1 to 1 sessions, Unit Group sessions and Medication administration.  Evaluation of Outcomes: Progressing  Physician Treatment Plan for Secondary Diagnosis: Principal Problem:   MDD (major depressive disorder), recurrent severe, without psychosis (HCC) Active Problems:   Suicidal ideations   Marijuana abuse  Long Term Goal(s): Improvement in symptoms so as ready for discharge Improvement in symptoms so as ready for discharge   Short Term Goals: Ability to identify changes in lifestyle to reduce recurrence of condition will improve Ability to demonstrate self-control will improve Ability to identify triggers associated with substance abuse/mental health issues will improve     Medication Management: Evaluate patient's response, side effects, and tolerance of medication regimen.  Therapeutic Interventions: 1 to 1 sessions, Unit Group sessions and Medication administration.  Evaluation of Outcomes: Progressing   RN Treatment Plan for Primary Diagnosis: MDD (major depressive disorder),  recurrent severe, without psychosis (HCC) Long Term Goal(s): Knowledge of disease and therapeutic regimen to maintain health will improve  Short Term Goals: Ability to  remain free from injury will improve, Ability to demonstrate self-control, Ability to participate in decision making will improve, Ability to verbalize feelings will improve, Ability to disclose and discuss suicidal ideas and Ability to identify and develop effective coping behaviors will improve  Medication Management: RN will administer medications as ordered by provider, will assess and evaluate patient's response and provide education to patient for prescribed medication. RN will report any adverse and/or side effects to prescribing provider.  Therapeutic Interventions: 1 on 1 counseling sessions, Psychoeducation, Medication administration, Evaluate responses to treatment, Monitor vital signs and CBGs as ordered, Perform/monitor CIWA, COWS, AIMS and Fall Risk screenings as ordered, Perform wound care treatments as ordered.  Evaluation of Outcomes: Progressing   LCSW Treatment Plan for Primary Diagnosis: MDD (major depressive disorder), recurrent severe, without psychosis (HCC) Long Term Goal(s): Safe transition to appropriate next level of care at discharge, Engage patient in therapeutic group addressing interpersonal concerns.  Short Term Goals: Engage patient in aftercare planning with referrals and resources, Increase social support, Increase emotional regulation, Facilitate acceptance of mental health diagnosis and concerns, Identify triggers associated with mental health/substance abuse issues and Increase skills for wellness and recovery  Therapeutic Interventions: Assess for all discharge needs, 1 to 1 time with Social worker, Explore available resources and support systems, Assess for adequacy in community support network, Educate family and significant other(s) on suicide prevention, Complete Psychosocial Assessment, Interpersonal group therapy.  Evaluation of Outcomes: Progressing   Progress in Treatment: Attending groups: Yes. Participating in groups: Yes. Taking medication as  prescribed: Yes. Toleration medication: Yes. Family/Significant other contact made: Yes, individual(s) contacted:  father Patient understands diagnosis: No. Discussing patient identified problems/goals with staff: Yes. Medical problems stabilized or resolved: Yes. Denies suicidal/homicidal ideation: Yes. Issues/concerns per patient self-inventory: No.   New problem(s) identified: No, Describe:  None   New Short Term/Long Term Goal(s): medication stabilization, elimination of SI thoughts, development of comprehensive mental wellness plan.   Patient Goals:  "To go home"   Discharge Plan or Barriers: Patient is to return home to stay with father. Patient is to receive therapy and medication management through [facility]  Reason for Continuation of Hospitalization: Depression Medication stabilization Suicidal ideation  Estimated Length of Stay: 3 to 5 days   Attendees: Patient:  10/08/2020   Physician:  10/08/2020   Nursing:  10/08/2020   RN Care Manager: 10/08/2020   Social Worker: Ruthann Cancer, LCSW 10/08/2020   Recreational Therapist:  10/08/2020   Other:  10/08/2020   Other:  10/08/2020   Other: 10/08/2020     Scribe for Treatment Team: Otelia Santee, LCSW 10/13/2020 1:41 PM

## 2020-10-13 NOTE — Progress Notes (Signed)
   10/13/20 0500  Psych Admission Type (Psych Patients Only)  Admission Status Voluntary  Psychosocial Assessment  Patient Complaints None  Eye Contact Brief  Facial Expression Animated  Affect Flat  Speech Logical/coherent  Interaction Assertive  Motor Activity Other (Comment) (WNL)  Appearance/Hygiene Unremarkable  Behavior Characteristics Cooperative  Mood Pleasant  Thought Process  Coherency WDL  Content WDL  Delusions None reported or observed  Perception WDL  Hallucination None reported or observed  Judgment Poor  Confusion None  Danger to Self  Current suicidal ideation? Denies  Danger to Others  Danger to Others None reported or observed

## 2020-10-14 DIAGNOSIS — F332 Major depressive disorder, recurrent severe without psychotic features: Secondary | ICD-10-CM | POA: Diagnosis not present

## 2020-10-14 LAB — BASIC METABOLIC PANEL
Anion gap: 7 (ref 5–15)
BUN: 10 mg/dL (ref 6–20)
CO2: 26 mmol/L (ref 22–32)
Calcium: 8.9 mg/dL (ref 8.9–10.3)
Chloride: 102 mmol/L (ref 98–111)
Creatinine, Ser: 0.82 mg/dL (ref 0.61–1.24)
GFR, Estimated: >60 mL/min (ref >60)
Glucose, Bld: 86 mg/dL (ref 70–99)
Potassium: 3.8 mmol/L (ref 3.5–5.1)
Sodium: 135 mmol/L (ref 135–145)

## 2020-10-14 MED ORDER — OXCARBAZEPINE 300 MG PO TABS: 300.0000 mg | ORAL_TABLET | Freq: Two times a day (BID) | ORAL | 0 refills | Status: AC

## 2020-10-14 MED ORDER — SERTRALINE HCL 50 MG PO TABS: 50.0000 mg | ORAL_TABLET | Freq: Every day | ORAL | 0 refills | Status: AC

## 2020-10-14 NOTE — BHH Suicide Risk Assessment (Signed)
[facility] Discharge Suicide Risk Assessment   Principal Problem: MDD (major depressive disorder), recurrent severe, without psychosis (HCC) Discharge Diagnoses: Principal Problem:   MDD (major depressive disorder), recurrent severe, without psychosis (HCC) Active Problems:   Suicidal ideations   Marijuana abuse   Total Time spent with patient: 15 minutes  Musculoskeletal: Strength & Muscle Tone: within normal limits Gait & Station: normal Patient leans: N/A  Psychiatric Specialty Exam: Review of Systems  All other systems reviewed and are negative.   Blood pressure (!) 104/92, pulse 66, temperature 97.9 F (36.6 C), temperature source Oral, resp. rate 18, height 6' (1.829 m), weight 75.8 kg, SpO2 100 %.Body mass index is 22.65 kg/m.  General Appearance: Casual  Eye Contact::  Good  Speech:  Normal Rate409  Volume:  Normal  Mood:  Euthymic  Affect:  Congruent  Thought Process:  Coherent and Descriptions of Associations: Intact  Orientation:  Full (Time, Place, and Person)  Thought Content:  Logical  Suicidal Thoughts:  No  Homicidal Thoughts:  No  Memory:  Immediate;   Good Recent;   Good Remote;   Good  Judgement:  Intact  Insight:  Fair  Psychomotor Activity:  Normal  Concentration:  Good  Recall:  Good  Fund of Knowledge:Good  Language: Good  Akathisia:  Negative  Handed:  Right  AIMS (if indicated):     Assets:  Desire for Improvement Housing Physical Health Resilience Social Support  Sleep:  Number of Hours: 5.75  Cognition: WNL  ADL's:  Intact   Mental Status Per Nursing Assessment::   On Admission:  Suicidal ideation indicated by patient  Demographic Factors:  Male and Unemployed  Loss Factors: Financial problems/change in socioeconomic status  Historical Factors: Impulsivity  Risk Reduction Factors:   Living with another person, especially a relative and Positive social support  Continued Clinical Symptoms:  Depression:    Impulsivity  Cognitive Features That Contribute To Risk:  None    Suicide Risk:  Minimal: No identifiable suicidal ideation.  Patients presenting with no risk factors but with morbid ruminations; may be classified as minimal risk based on the severity of the depressive symptoms   Follow-up Information    [facility] Follow up on 10/17/2020.   Why: Therapy appointment is scheduled for 10/17/2020 at 3:30pm in the [place] office.  Please bring your insurance card and arrive 20 mintues early for paperwork.  Medication Management appointment is scheduled for 10/30/2020 at 7:00am at the same location.  Contact information: [address] [place] [address] [phone]               Plan Of Care/Follow-up recommendations:  Activity:  ad lib  Antonieta Pert, MD 10/14/2020, 9:27 AM

## 2020-10-14 NOTE — Progress Notes (Signed)
D:  Patient's self inventory sheet, patient sleeps good, sleep medication helpful.  Good appetite, normal energy level, good concentration.  Good appetite, normal energy level, good concentration.  Rated depression and anxiety 5, denied hopeless.  Denied withdrawals.  Denied SI.  Denied physical problems.  Denied physical pain.  Goal learning positive coping skills to deal with stress.  Plans to participate in groups.  Denied discharge plans. A:  Medications administered per MD orders.  Emotional support and encouragement given patient. R:  Denied SI and HI, contracts for safety.  Denied A/V hallucinations.  Safety maintained with 15 minute checks.

## 2020-10-14 NOTE — Progress Notes (Signed)
   10/13/20 2303  Psych Admission Type (Psych Patients Only)  Admission Status Voluntary  Psychosocial Assessment  Patient Complaints Agitation  Eye Contact Brief  Facial Expression Animated  Affect Flat  Speech Logical/coherent  Interaction Assertive  Motor Activity Other (Comment) (WNL)  Appearance/Hygiene Unremarkable  Behavior Characteristics Cooperative  Mood Pleasant  Thought Process  Coherency WDL  Content WDL  Delusions None reported or observed  Perception WDL  Hallucination None reported or observed  Judgment WDL  Confusion WDL  Danger to Self  Current suicidal ideation? Denies  Danger to Others  Danger to Others None reported or observed

## 2020-10-14 NOTE — Progress Notes (Signed)
  [facility] Adult Case Management Discharge Plan :  Will you be returning to the same living situation after discharge:  Yes,  Father's Home At discharge, do you have transportation home?: Yes,  Father  Do you have the ability to pay for your medications: Yes,  Insurance   Release of information consent forms completed and in the chart;  Patient's signature needed at discharge.  Patient to Follow up at:  Follow-up Information    [facility] Follow up on 10/17/2020.   Why: Therapy appointment is scheduled for 10/17/2020 at 3:30pm in the [place] office.  Please bring your insurance card and arrive 20 mintues early for paperwork.  Medication Management appointment is scheduled for 10/30/2020 at 7:00am at the same location.  Contact information: [address] [place] [address] [phone]               Next level of care provider has access to [facility] Link:no  Safety Planning and Suicide Prevention discussed: Yes,  with patient and father   Have you used any form of tobacco in the last 30 days? (Cigarettes, Smokeless Tobacco, Cigars, and/or Pipes): No  Has patient been referred to the Quitline?: N/A patient is not a smoker  Patient has been referred for addiction treatment: N/A  Aram Beecham, LCSWA 10/14/2020, 9:25 AM

## 2020-10-14 NOTE — Plan of Care (Signed)
Nurse discussed coping skills with patient.  
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
## 2020-10-14 NOTE — Progress Notes (Signed)
Discharge Note:  Patient discharged home with dad.  Suicide prevention information given and discussed with patient who stated he understood and had no questions.  Patient stated he received all his belongings, clothing, toiletries, misc items, etc.  Patient stated he appreciated all assistance from [facility] staff.  All required discharge information given. Patient denied SI and HI.  Denied A/V hallucinations.

## 2020-10-14 NOTE — [facility] Group Notes (Signed)
[facility] Group Notes:  (Nursing/MHT/Case Management/Adjunct)  Date:  10/14/2020  Time:  12:40 PM  Type of Therapy:  Group Therapy  Participation Level:  Active  Participation Quality:  Appropriate  Affect:  Appropriate  Cognitive:  Alert and Appropriate  Insight:  Good  Engagement in Group:  Engaged  Modes of Intervention:  Activity, Discussion, Education and Exploration  Summary of Progress/Problems: we read falling in the hole poem and how it relates to negative behavioral pattern. Quintrell identified with the poem , and howe we '' repeat cycles that are badMalva Limes 10/14/2020, 12:40 PM

## 2020-10-17 DIAGNOSIS — F209 Schizophrenia, unspecified: Secondary | ICD-10-CM | POA: Diagnosis not present

## 2020-10-17 DIAGNOSIS — F1220 Cannabis dependence, uncomplicated: Secondary | ICD-10-CM | POA: Diagnosis not present

## 2020-10-17 DIAGNOSIS — F339 Major depressive disorder, recurrent, unspecified: Secondary | ICD-10-CM | POA: Diagnosis not present

## 2020-10-17 DIAGNOSIS — F4010 Social phobia, unspecified: Secondary | ICD-10-CM | POA: Diagnosis not present

## 2021-02-21 DIAGNOSIS — S71102A Unspecified open wound, left thigh, initial encounter: Secondary | ICD-10-CM | POA: Diagnosis not present

## 2021-02-21 DIAGNOSIS — S71132A Puncture wound without foreign body, left thigh, initial encounter: Secondary | ICD-10-CM | POA: Diagnosis not present

## 2021-02-21 DIAGNOSIS — S81802A Unspecified open wound, left lower leg, initial encounter: Secondary | ICD-10-CM | POA: Diagnosis not present

## 2021-02-21 DIAGNOSIS — R0689 Other abnormalities of breathing: Secondary | ICD-10-CM | POA: Diagnosis not present

## 2021-02-21 DIAGNOSIS — S79922A Unspecified injury of left thigh, initial encounter: Secondary | ICD-10-CM | POA: Diagnosis not present

## 2021-02-21 DIAGNOSIS — W3400XA Accidental discharge from unspecified firearms or gun, initial encounter: Secondary | ICD-10-CM | POA: Diagnosis not present

## 2021-02-21 DIAGNOSIS — Z23 Encounter for immunization: Secondary | ICD-10-CM | POA: Diagnosis not present

## 2021-02-21 DIAGNOSIS — S71142A Puncture wound with foreign body, left thigh, initial encounter: Secondary | ICD-10-CM | POA: Diagnosis not present

## 2021-02-21 DIAGNOSIS — I10 Essential (primary) hypertension: Secondary | ICD-10-CM | POA: Diagnosis not present

## 2021-02-21 DIAGNOSIS — R52 Pain, unspecified: Secondary | ICD-10-CM | POA: Diagnosis not present

## 2021-02-22 DIAGNOSIS — S71132A Puncture wound without foreign body, left thigh, initial encounter: Secondary | ICD-10-CM | POA: Diagnosis not present

## 2021-02-22 DIAGNOSIS — Z4800 Encounter for change or removal of nonsurgical wound dressing: Secondary | ICD-10-CM | POA: Diagnosis not present

## 2021-02-22 DIAGNOSIS — W3400XA Accidental discharge from unspecified firearms or gun, initial encounter: Secondary | ICD-10-CM | POA: Diagnosis not present

## 2021-02-22 DIAGNOSIS — S81802A Unspecified open wound, left lower leg, initial encounter: Secondary | ICD-10-CM | POA: Diagnosis not present

## 2021-02-22 DIAGNOSIS — S80812A Abrasion, left lower leg, initial encounter: Secondary | ICD-10-CM | POA: Diagnosis not present

## 2021-02-25 DIAGNOSIS — S71102D Unspecified open wound, left thigh, subsequent encounter: Secondary | ICD-10-CM | POA: Diagnosis not present

## 2021-02-25 DIAGNOSIS — W3400XD Accidental discharge from unspecified firearms or gun, subsequent encounter: Secondary | ICD-10-CM | POA: Diagnosis not present

## 2021-02-25 DIAGNOSIS — S71132D Puncture wound without foreign body, left thigh, subsequent encounter: Secondary | ICD-10-CM | POA: Diagnosis not present

## 2021-05-14 IMAGING — DX DG CHEST 2V
2 series · 2 of 2 positions shown · non-contrast
Comparison: None.

CLINICAL DATA: Chest pain

EXAM:
CHEST - 2 VIEW

[chest pa]
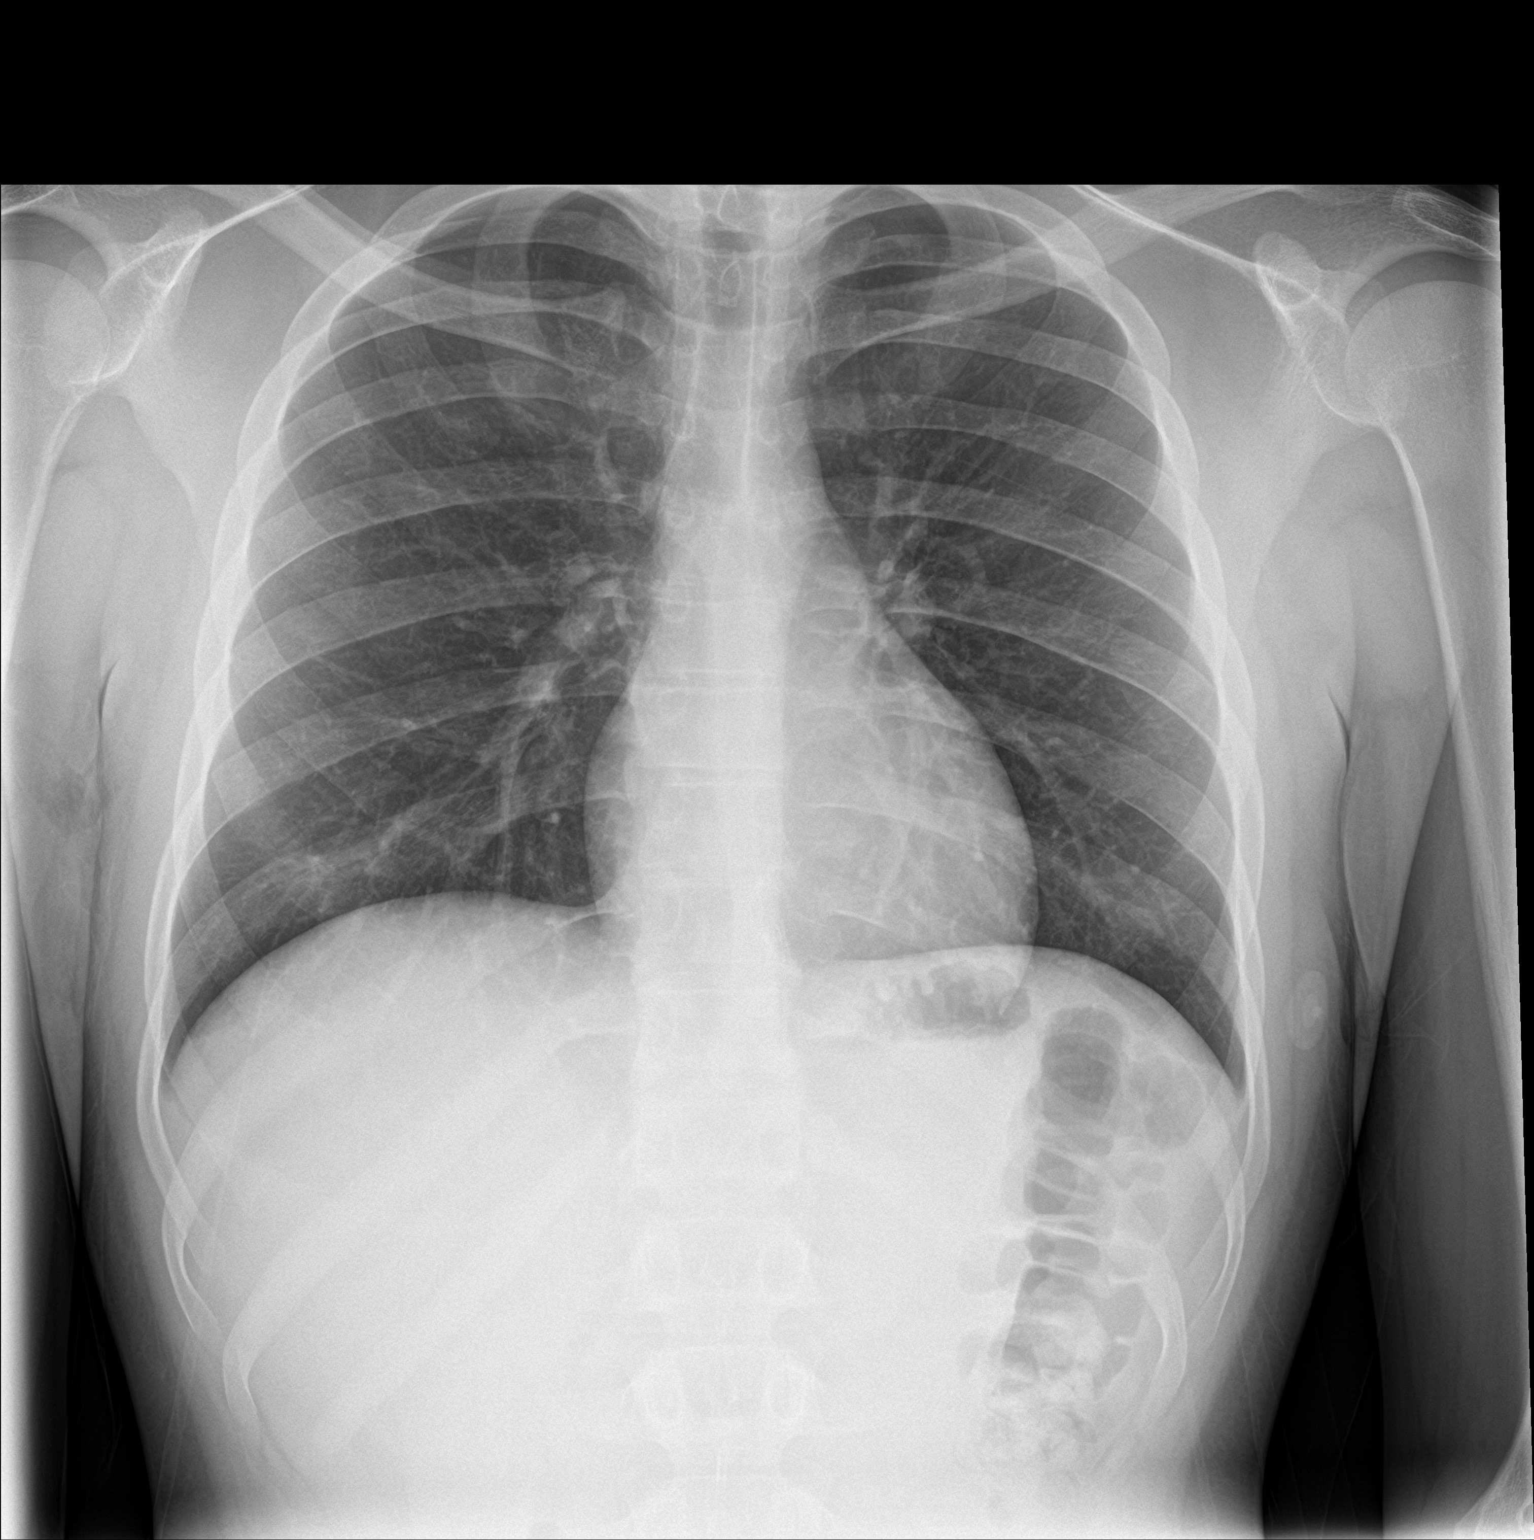

[chest lat]
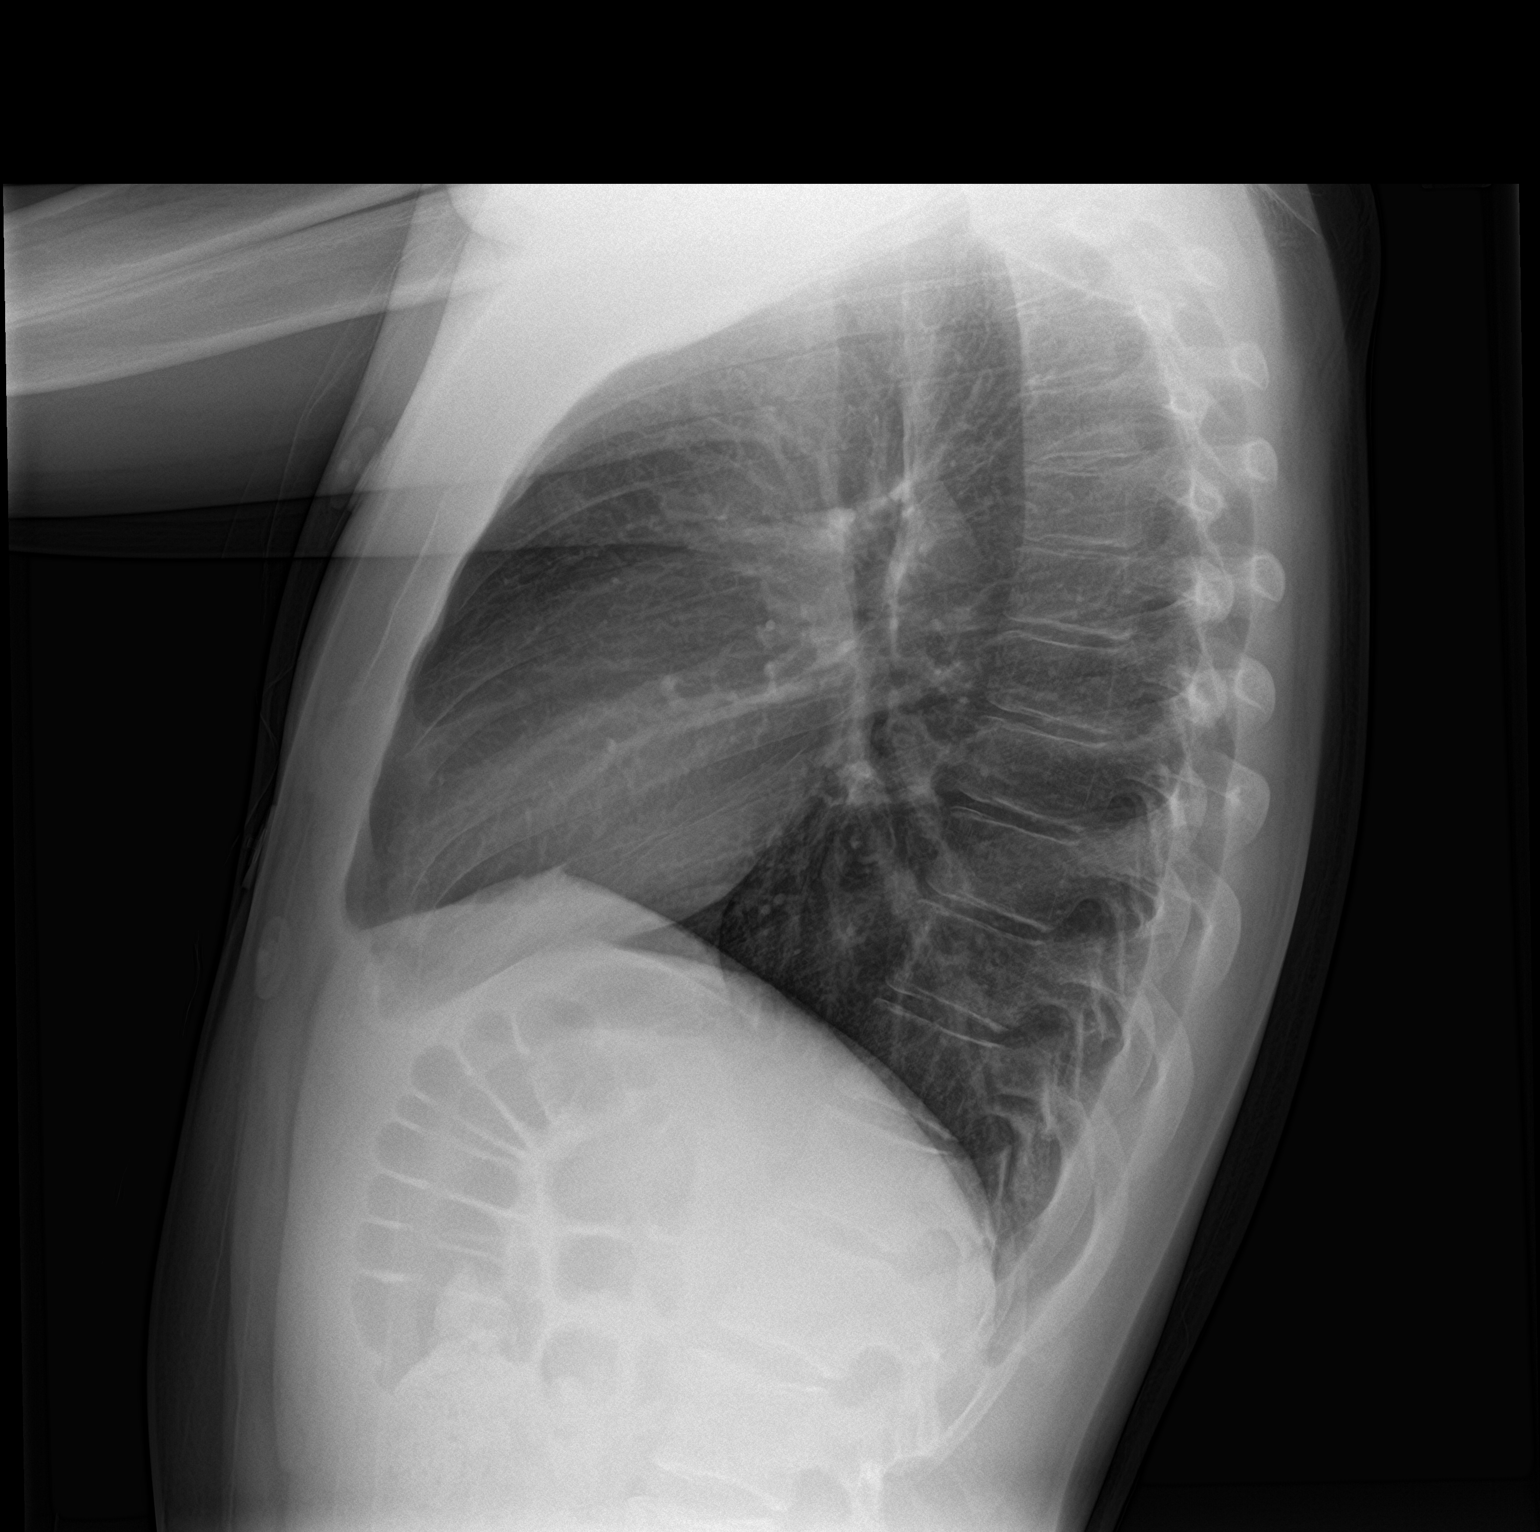

[2 of 2 positions shown; findings below may reference images not displayed]

FINDINGS: The heart size and mediastinal contours are within normal limits.
Both lungs are clear. The visualized skeletal structures are
unremarkable.
IMPRESSION: No active cardiopulmonary disease.

## 2021-07-06 ENCOUNTER — Other Ambulatory Visit: Payer: Self-pay

## 2021-07-06 ENCOUNTER — Ambulatory Visit (HOSPITAL_COMMUNITY)
Admission: EM | Admit: 2021-07-06 | Discharge: 2021-07-06 | Disposition: A | Discharge status: Home / Self Care | Payer: BC Managed Care – PPO | Attending: Family Medicine | Admitting: Family Medicine

## 2021-07-06 ENCOUNTER — Encounter (HOSPITAL_COMMUNITY): Payer: Self-pay

## 2021-07-06 DIAGNOSIS — Z20822 Contact with and (suspected) exposure to covid-19: Secondary | ICD-10-CM | POA: Insufficient documentation

## 2021-07-06 DIAGNOSIS — R439 Unspecified disturbances of smell and taste: Secondary | ICD-10-CM | POA: Diagnosis not present

## 2021-07-06 DIAGNOSIS — J069 Acute upper respiratory infection, unspecified: Secondary | ICD-10-CM

## 2021-07-06 DIAGNOSIS — R432 Parageusia: Secondary | ICD-10-CM

## 2021-07-06 DIAGNOSIS — R0981 Nasal congestion: Secondary | ICD-10-CM

## 2021-07-06 HISTORY — DX: Accidental discharge from unspecified firearms or gun, initial encounter: W34.00XA

## 2021-07-06 HISTORY — DX: Unspecified firearm discharge, undetermined intent, initial encounter: Y24.9XXA

## 2021-07-06 LAB — POC INFLUENZA A AND B ANTIGEN (URGENT CARE ONLY)
INFLUENZA A ANTIGEN, POC: NEGATIVE
INFLUENZA B ANTIGEN, POC: NEGATIVE

## 2021-07-06 NOTE — ED Triage Notes (Signed)
Pt presents with c/o loss of taste and nasal congestion x 3 days.

## 2021-07-06 NOTE — Discharge Instructions (Addendum)
Your flu test was negative.   COVID swab will be done today as you are discharged. Results should be in by tomorrow afternoon. Our staff will call you if results are positive. If positive you should quarantine for another 5 days.  Use nyquil or mucinex as needed for your symptoms, and rest and drink enough fluids.
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
## 2021-07-06 NOTE — ED Notes (Signed)
Flu swab in lab 

## 2021-07-07 LAB — SARS CORONAVIRUS 2 (TAT 6-24 HRS): SARS Coronavirus 2: NEGATIVE

## 2021-07-17 ENCOUNTER — Other Ambulatory Visit: Payer: Self-pay

## 2021-07-17 ENCOUNTER — Encounter (HOSPITAL_COMMUNITY): Payer: Self-pay

## 2021-07-17 ENCOUNTER — Ambulatory Visit (HOSPITAL_COMMUNITY)
Admission: EM | Admit: 2021-07-17 | Discharge: 2021-07-17 | Disposition: A | Discharge status: Home / Self Care | Payer: BC Managed Care – PPO | Attending: Urgent Care | Admitting: Urgent Care

## 2021-07-17 DIAGNOSIS — M7918 Myalgia, other site: Secondary | ICD-10-CM

## 2021-07-17 MED ORDER — NAPROXEN 375 MG PO TABS: 375.0000 mg | ORAL_TABLET | Freq: Two times a day (BID) | ORAL | 0 refills | Status: AC

## 2021-07-17 MED ORDER — METHOCARBAMOL 500 MG PO TABS: 500.0000 mg | ORAL_TABLET | Freq: Three times a day (TID) | ORAL | 0 refills | Status: AC | PRN
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
## 2021-07-17 NOTE — Discharge Instructions (Addendum)
Alternate ice and heat to your shoulders (traps) Massage your shoulders if possible Take naproxen as needed for pain with food. Do not take any additional OTC NSAIDs. Take muscle relaxer as needed. This may make you drowsy. Use for as short of a time as needed. If any new symptoms develop such as tingling in your arms/ hands or headache, follow up promptly.
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
## 2021-11-26 DIAGNOSIS — M795 Residual foreign body in soft tissue: Secondary | ICD-10-CM | POA: Diagnosis not present

## 2021-11-26 DIAGNOSIS — M79652 Pain in left thigh: Secondary | ICD-10-CM | POA: Diagnosis not present

## 2021-11-26 DIAGNOSIS — S70352A Superficial foreign body, left thigh, initial encounter: Secondary | ICD-10-CM | POA: Diagnosis not present

## 2022-07-27 DIAGNOSIS — R0602 Shortness of breath: Secondary | ICD-10-CM | POA: Diagnosis not present

## 2022-07-27 DIAGNOSIS — R002 Palpitations: Secondary | ICD-10-CM | POA: Diagnosis not present

## 2022-07-27 DIAGNOSIS — R42 Dizziness and giddiness: Secondary | ICD-10-CM | POA: Diagnosis not present

## 2022-07-27 DIAGNOSIS — R55 Syncope and collapse: Secondary | ICD-10-CM | POA: Diagnosis not present

## 2022-07-27 DIAGNOSIS — E162 Hypoglycemia, unspecified: Secondary | ICD-10-CM | POA: Diagnosis not present

## 2022-07-27 DIAGNOSIS — Z20822 Contact with and (suspected) exposure to covid-19: Secondary | ICD-10-CM | POA: Diagnosis not present

## 2022-08-05 DIAGNOSIS — R002 Palpitations: Secondary | ICD-10-CM | POA: Diagnosis not present

## 2022-08-05 DIAGNOSIS — R55 Syncope and collapse: Secondary | ICD-10-CM | POA: Diagnosis not present

## 2022-08-05 DIAGNOSIS — F1011 Alcohol abuse, in remission: Secondary | ICD-10-CM | POA: Diagnosis not present

## 2022-08-05 DIAGNOSIS — R1013 Epigastric pain: Secondary | ICD-10-CM | POA: Diagnosis not present

## 2022-08-08 DIAGNOSIS — R0781 Pleurodynia: Secondary | ICD-10-CM | POA: Diagnosis not present

## 2022-08-18 DIAGNOSIS — R55 Syncope and collapse: Secondary | ICD-10-CM | POA: Diagnosis not present

## 2023-06-13 ENCOUNTER — Encounter (HOSPITAL_BASED_OUTPATIENT_CLINIC_OR_DEPARTMENT_OTHER): Payer: Self-pay

## 2023-06-13 ENCOUNTER — Other Ambulatory Visit: Payer: Self-pay

## 2023-06-13 ENCOUNTER — Emergency Department (HOSPITAL_BASED_OUTPATIENT_CLINIC_OR_DEPARTMENT_OTHER)
Admission: EM | Admit: 2023-06-13 | Discharge: 2023-06-13 | Disposition: A | Discharge status: Home / Self Care | Payer: BC Managed Care – PPO | Attending: Emergency Medicine | Admitting: Emergency Medicine

## 2023-06-13 DIAGNOSIS — J069 Acute upper respiratory infection, unspecified: Secondary | ICD-10-CM | POA: Insufficient documentation

## 2023-06-13 DIAGNOSIS — J45909 Unspecified asthma, uncomplicated: Secondary | ICD-10-CM | POA: Diagnosis not present

## 2023-06-13 DIAGNOSIS — R0602 Shortness of breath: Secondary | ICD-10-CM | POA: Diagnosis not present

## 2023-06-13 DIAGNOSIS — Z1152 Encounter for screening for COVID-19: Secondary | ICD-10-CM | POA: Diagnosis not present

## 2023-06-13 HISTORY — DX: Unspecified asthma, uncomplicated: J45.909

## 2023-06-13 LAB — RESP PANEL BY RT-PCR (RSV, FLU A&B, COVID)  RVPGX2
Influenza A by PCR: NEGATIVE
Influenza B by PCR: NEGATIVE
Resp Syncytial Virus by PCR: NEGATIVE
SARS Coronavirus 2 by RT PCR: NEGATIVE

## 2023-06-13 NOTE — ED Notes (Signed)
RT to see pt for shortness of breath. Pt met in triage and walked to ED Room 6. Pt able to ambulate to room without difficulty. Able to speak in complete sentences, SpO2 while ambulating 99%, clear bilateral breath sounds. RT will continue to monitor and be available as needed.
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
## 2023-06-13 NOTE — Discharge Instructions (Signed)
Your flu COVID and RSV testing are still pending.  You can look them up under MyChart to see the results.

## 2023-06-13 NOTE — ED Triage Notes (Signed)
Pt came in contact with covid. Reports congestion, cough and sore throat possibly due to coughing. Symptoms began last Wednesday. Soreness in chest

## 2024-01-06 DIAGNOSIS — K648 Other hemorrhoids: Secondary | ICD-10-CM | POA: Diagnosis not present
